# Patient Record
Sex: Male | Born: 1990 | Race: White | Hispanic: No | Marital: Married | Smoking: Former smoker
Health system: Southern US, Community
[De-identification: ages and names within clinical notes are randomized; demographics above are authoritative.]

## PROBLEM LIST (undated history)

## (undated) DIAGNOSIS — F319 Bipolar disorder, unspecified: Secondary | ICD-10-CM

## (undated) DIAGNOSIS — F909 Attention-deficit hyperactivity disorder, unspecified type: Secondary | ICD-10-CM

## (undated) HISTORY — DX: Bipolar disorder, unspecified: F31.9

## (undated) HISTORY — DX: Attention-deficit hyperactivity disorder, unspecified type: F90.9

---

## 2011-10-07 ENCOUNTER — Ambulatory Visit: Payer: Self-pay | Admitting: Internal Medicine

## 2014-02-01 IMAGING — CR DG FOOT COMPLETE 3+V*L*
1 series · 3 of 3 positions shown · non-contrast
Comparison: none

REASON FOR EXAM: L foot pain and swelling due to injury today.
COMMENTS:

[Series 1: ap · 0.17mm/px · 3 of 3 slices shown]
[im 1/3]
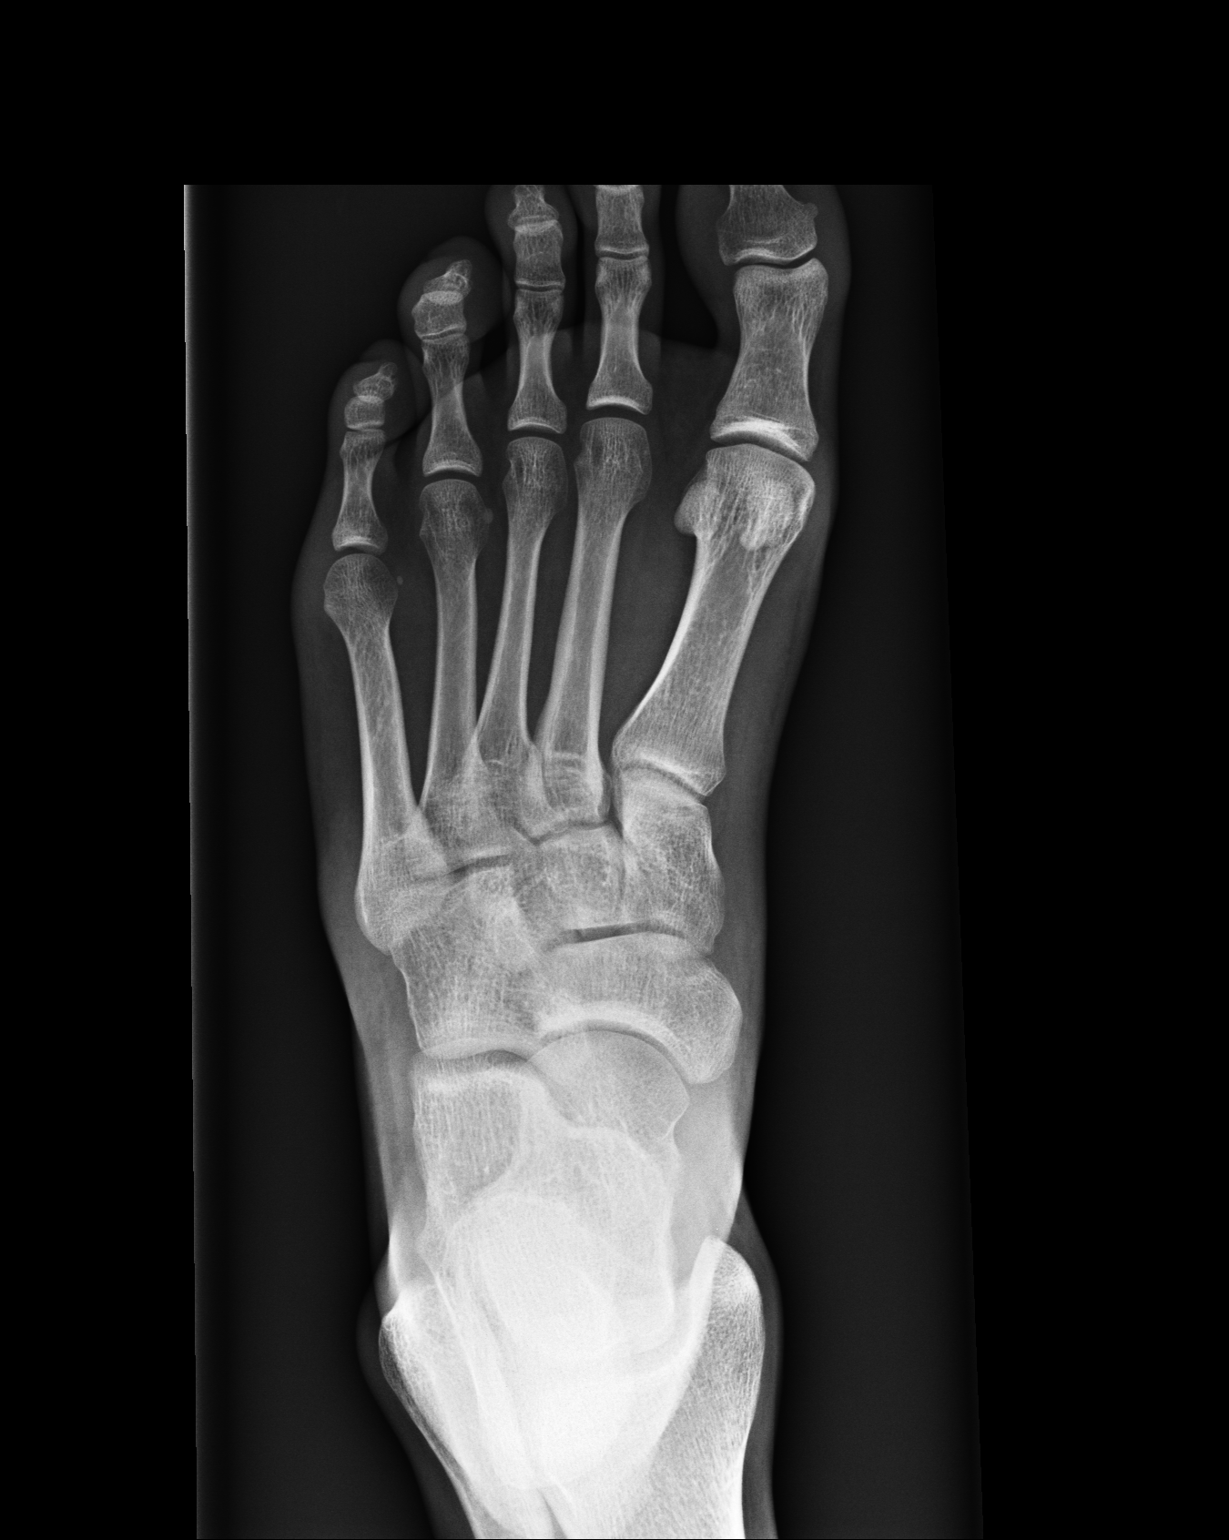
[im 2/3]
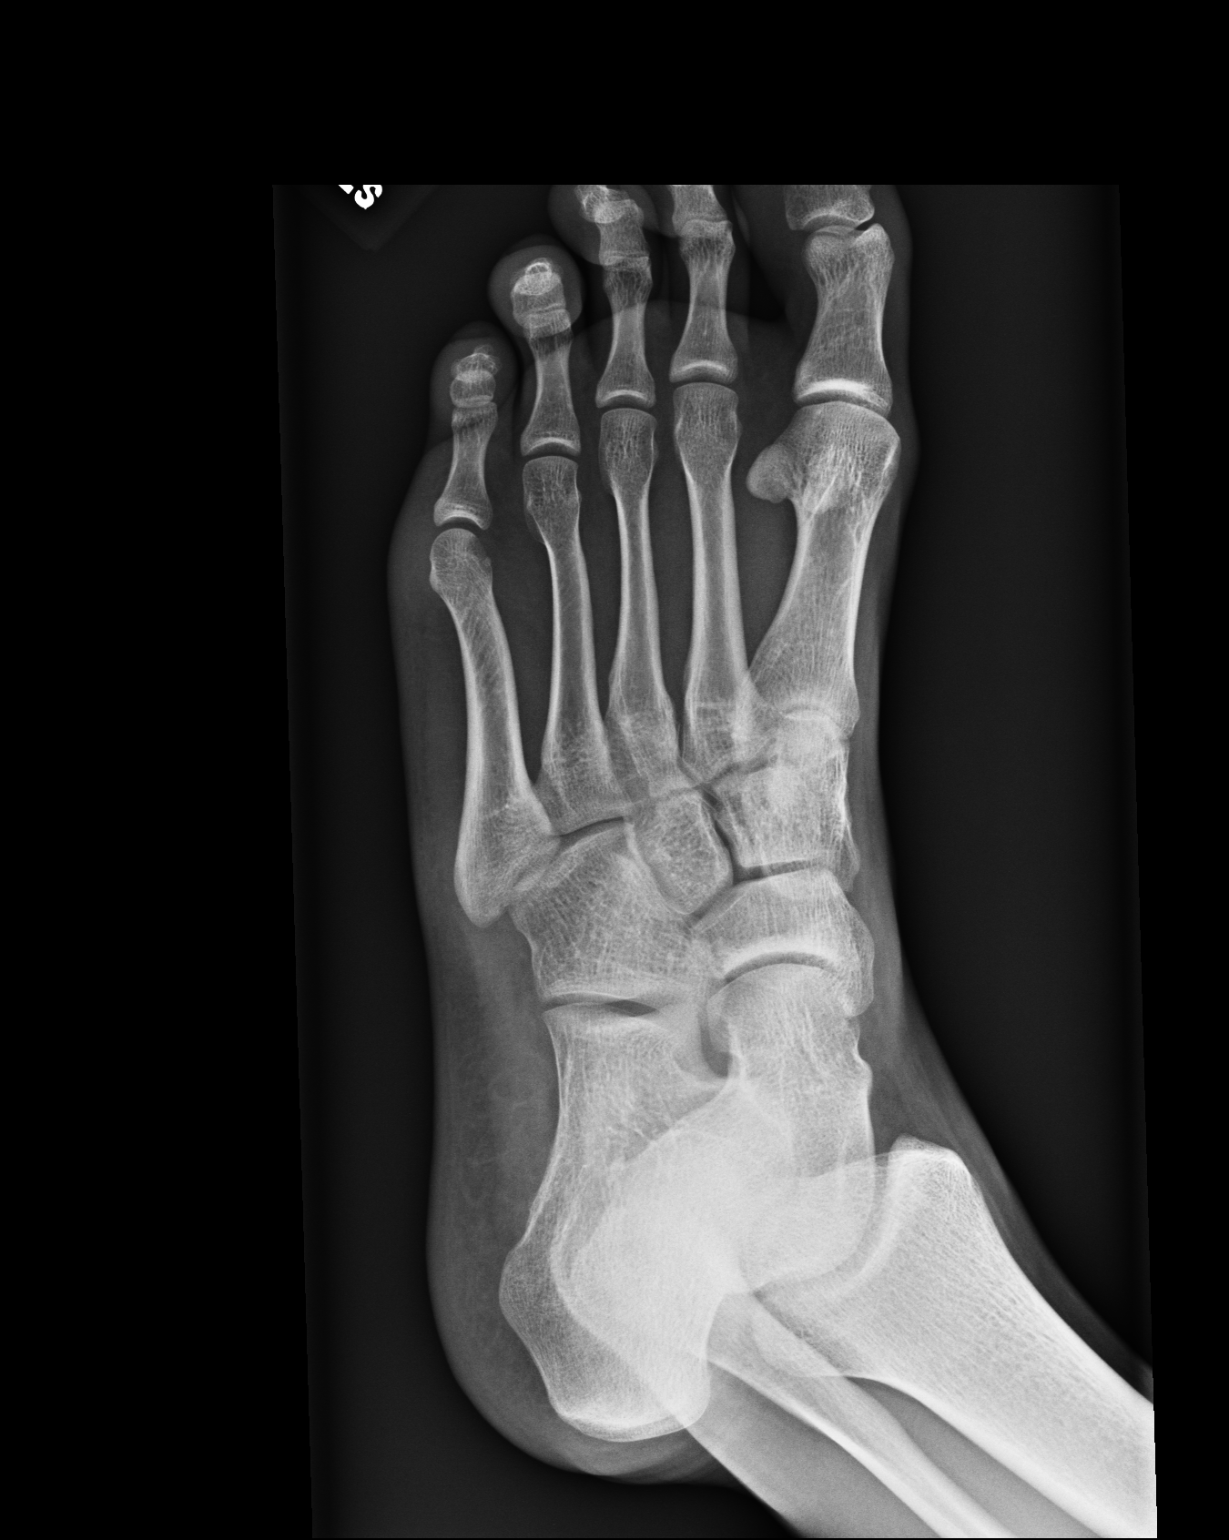
[im 3/3]
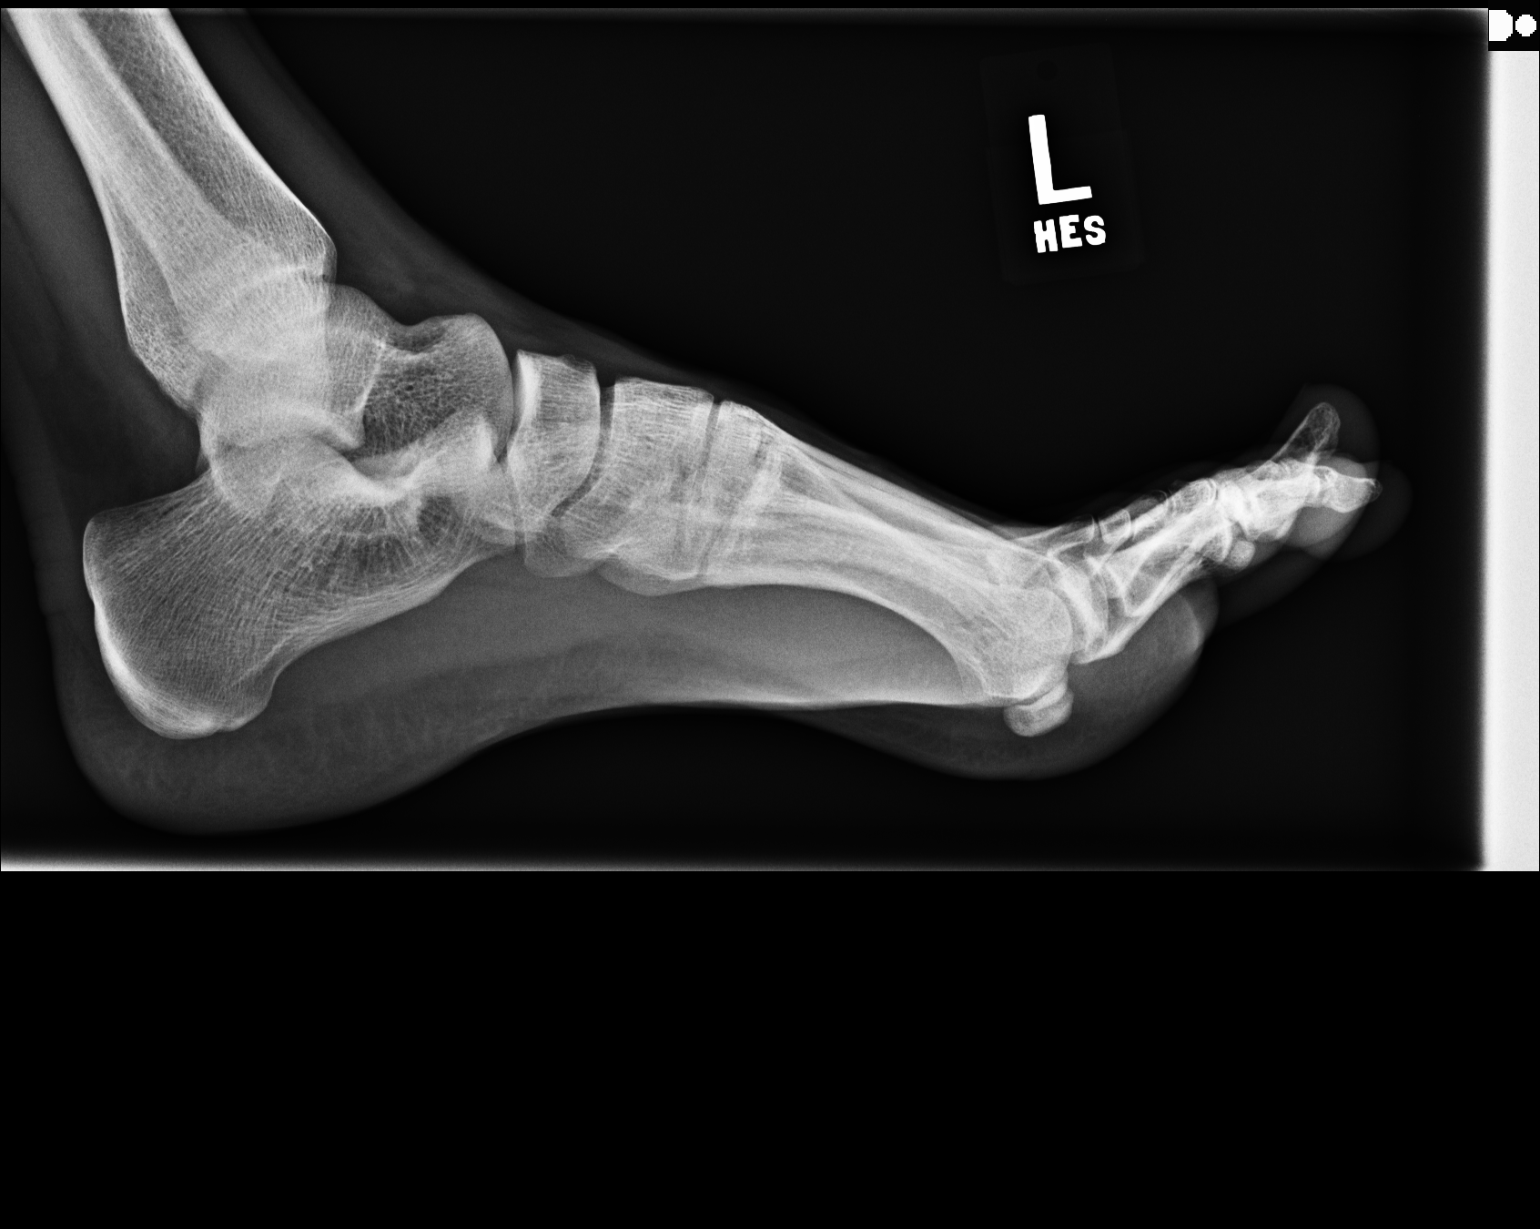

[3 of 3 positions shown; findings below may reference images not displayed]

PROCEDURE:     MDR - MDR FOOT LT COMP W/OBLQUES  - October 07, 2011 [DATE]

RESULT:     Three views of the left foot are submitted. The bones appear
adequately mineralized. Specific attention to the metatarsals reveals no
acute fracture or dislocation. The phalanges as well as the tarsal bones are
grossly normal. The overlying soft tissues are prominent laterally.
IMPRESSION: There is soft tissue swelling over the base of the fifth
metatarsal, but I do not see evidence of an underlying fracture.

[REDACTED]

## 2015-04-03 ENCOUNTER — Encounter: Payer: Self-pay | Admitting: Internal Medicine

## 2015-04-03 ENCOUNTER — Other Ambulatory Visit: Payer: Self-pay | Admitting: Internal Medicine

## 2015-04-03 DIAGNOSIS — F319 Bipolar disorder, unspecified: Secondary | ICD-10-CM | POA: Insufficient documentation

## 2015-04-03 DIAGNOSIS — K580 Irritable bowel syndrome with diarrhea: Secondary | ICD-10-CM | POA: Insufficient documentation

## 2015-04-03 DIAGNOSIS — M4120 Other idiopathic scoliosis, site unspecified: Secondary | ICD-10-CM | POA: Insufficient documentation

## 2018-03-26 ENCOUNTER — Ambulatory Visit: Payer: Self-pay | Admitting: Psychiatry

## 2018-04-01 ENCOUNTER — Encounter: Payer: Self-pay | Admitting: Psychiatry

## 2018-04-01 ENCOUNTER — Other Ambulatory Visit: Payer: Self-pay

## 2018-04-01 ENCOUNTER — Ambulatory Visit (INDEPENDENT_AMBULATORY_CARE_PROVIDER_SITE_OTHER): Payer: BLUE CROSS/BLUE SHIELD | Admitting: Psychiatry

## 2018-04-01 VITALS — BP 130/86 | HR 80 | Temp 98.8°F | Ht 72.0 in | Wt 158.4 lb

## 2018-04-01 DIAGNOSIS — F3181 Bipolar II disorder: Secondary | ICD-10-CM

## 2018-04-01 MED ORDER — BUPROPION HCL 100 MG PO TABS: 100.0000 mg | ORAL_TABLET | Freq: Every day | ORAL | 1 refills | Status: DC

## 2018-04-01 MED ORDER — ARIPIPRAZOLE 5 MG PO TABS: 7.5000 mg | ORAL_TABLET | Freq: Every day | ORAL | 1 refills | Status: DC

## 2018-04-01 NOTE — Patient Instructions (Signed)
Please get your TSH when you go to your Primary medical doctor. If not , we can give you a lab order for [facility].

## 2018-04-01 NOTE — Progress Notes (Signed)
Psychiatric Initial Adult Assessment   Patient Identification: John Stokes MRN:  [id number] Date of Evaluation:  04/01/2018 Referral Source: Magdalene Patricia MD Chief Complaint:  ' I am here to establish care." Chief Complaint    Establish Care; Medication Refill; Manic Behavior; Depression     Visit Diagnosis:    ICD-10-CM   1. Bipolar 2 disorder (HCC) F31.81    mild, mixed    History of Present Illness:  John Stokes is a 27 year old Caucasian male, employed, lives in [place], married, has a history of bipolar disorder type II, presented to the clinic today to establish care.  Patient reports he has been struggling with bipolar disorder since the past several years.  He reports he had at least 2 inpatient mental health admissions in the past around the age of 27 years old.  Patient reports his symptoms were stable up until few months ago on Abilify and Wellbutrin.  He used to be on Abilify 10 mg previously which was tapered down to 2.5 mg in the past.  Patient reports he has new psychosocial stressors including the birth of his daughter in July.  He reports he has to take care of the child as well as has some financial issues which is triggering his current mood symptoms.  Patient describes his mood lability as irritability, agitation, depressed mood and so on which goes back and forth every few hours to a few days.  Patient reports his sleep is also restless.  He reports he has to be up at night to take care of the baby since his wife also works. He hence is not interested in sleep aids.  Pt does report a history of being hyperactive, irritable, periods when he needed less sleep had more energy, was more outgoing and so on.  Patient denies any history of trauma.  Patient denies any history of perceptual disturbances.  Patient denies any suicidality or homicidality.  Patient denies abusing any illicit substances or alcohol.  Patient reports good social support from his wife and his  family.  Associated Signs/Symptoms: Depression Symptoms:  psychomotor agitation, disturbed sleep, (Hypo) Manic Symptoms:  Distractibility, Impulsivity, Irritable Mood, Labiality of Mood, Anxiety Symptoms:  denies Psychotic Symptoms:  denies PTSD Symptoms: Negative  Past Psychiatric History: Reports history of bipolar disorder type II, inpatient mental health admissions x2 around the age of 16 years.  Patient denies suicide attempts.  Patient reports he was admitted at [facility] in the past.  Patient used to be under the care of a provider at [facility] [place] in the past.  Most recently his medications were being prescribed by his primary medical doctor.  Previous Psychotropic Medications: Yes Abilify, Wellbutrin  Substance Abuse History in the last 12 months:  No.  Consequences of Substance Abuse: Negative  Past Medical History:  Past Medical History:  Diagnosis Date  . ADHD (attention deficit hyperactivity disorder)   . Bipolar disorder (HCC)    History reviewed. No pertinent surgical history.  Family Psychiatric History: Father-mental illness, mother-mental illness, sister-mental illness.  Family History:  Family History  Problem Relation Age of Onset  . Anxiety disorder Father   . Alcohol abuse Father   . Depression Mother   . Anxiety disorder Sister     Social History:   Social History   Socioeconomic History  . Marital status: Married    Spouse name: makayla  . Number of children: 1  . Years of education: Not on file  . Highest education level: High school graduate  Occupational History  . Not on file  Social Needs  . Financial resource strain: Not hard at all  . Food insecurity:    Worry: Never true    Inability: Never true  . Transportation needs:    Medical: No    Non-medical: No  Tobacco Use  . Smoking status: Former Smoker    Types: Cigarettes    Last attempt to quit: 04/01/2013    Years since quitting: 5.0  . Smokeless tobacco: Never Used   Substance and Sexual Activity  . Alcohol use: No    Alcohol/week: 0.0 standard drinks  . Drug use: Never  . Sexual activity: Yes  Lifestyle  . Physical activity:    Days per week: 0 days    Minutes per session: 0 min  . Stress: Rather much  Relationships  . Social connections:    Talks on phone: Not on file    Gets together: Not on file    Attends religious service: More than 4 times per year    Active member of club or organization: No    Attends meetings of clubs or organizations: Never    Relationship status: Married  Other Topics Concern  . Not on file  Social History Narrative  . Not on file    Additional Social History: Pt is married.  He is employed as a Hospital doctor at [employer] .  Patient lives in [place] with his wife and 13-month-old daughter.  Patient has good social support from his family.  Patient went up to high school.  Allergies:  No Known Allergies  Metabolic Disorder Labs: No results found for: HGBA1C, MPG No results found for: PROLACTIN No results found for: CHOL, TRIG, HDL, CHOLHDL, VLDL, LDLCALC No results found for: TSH  Therapeutic Level Labs: No results found for: LITHIUM No results found for: CBMZ No results found for: VALPROATE  Current Medications: Current Outpatient Medications  Medication Sig Dispense Refill  . ARIPiprazole (ABILIFY) 5 MG tablet Take 1.5 tablets (7.5 mg total) by mouth daily. 45 tablet 1  . buPROPion (WELLBUTRIN) 100 MG tablet Take 1 tablet (100 mg total) by mouth daily. 30 tablet 1   No current facility-administered medications for this visit.     Musculoskeletal: Strength & Muscle Tone: within normal limits Gait & Station: normal Patient leans: N/A  Psychiatric Specialty Exam: Review of Systems  Psychiatric/Behavioral: Positive for depression. The patient is nervous/anxious and has insomnia.   All other systems reviewed and are negative.   Blood pressure 130/86, pulse 80, temperature 98.8 F  (37.1 C), temperature source Oral, height 6' (1.829 m), weight 158 lb 6.4 oz (71.8 kg).Body mass index is 21.48 kg/m.  General Appearance: Casual  Eye Contact:  Fair  Speech:  Clear and Coherent  Volume:  Normal  Mood:  Dysphoric  Affect:  Appropriate  Thought Process:  Goal Directed and Descriptions of Associations: Intact  Orientation:  Full (Time, Place, and Person)  Thought Content:  Logical  Suicidal Thoughts:  No  Homicidal Thoughts:  No  Memory:  Immediate;   Fair Recent;   Fair Remote;   Fair  Judgement:  Fair  Insight:  Fair  Psychomotor Activity:  Normal  Concentration:  Concentration: Fair and Attention Span: Fair  Recall:  [employer] of Knowledge:Fair  Language: Fair  Akathisia:  No  Handed:  Right  AIMS (if indicated): denies tremors, rigidity,stiffness  Assets:  Communication Skills Desire for Improvement Social Support  ADL's:  Intact  Cognition: WNL  Sleep:  restless since he has a newborn at home   Screenings:   Assessment and Plan: John Stokes is a 27 year old Caucasian male, lives in [place], has a history of bipolar disorder type II, presented to the clinic today to establish care.  Patient is biologically predisposed given his family history of mental health problems.  Patient has psychosocial stressors of having a newborn baby.  Patient is compliant on his medications and is motivated to stay on it.  Patient denies any suicidality or perceptual disturbances or homicidality.  Plan as noted below.  Plan Bipolar disorder type II Increase Abilify to 7.5 mg daily Change Wellbutrin to 100 mg p.o. daily.  We will get the following labs-TSH.  Patient reports she will get it from his PMD.  Pt was able to fill out a mood disorder questionnaire and scored very high on the same.  Follow-up in clinic in 2 weeks or sooner if needed.  More than 50 % of the time was spent for psychoeducation and supportive psychotherapy and care coordination.  This note was  generated in part or whole with voice recognition software. Voice recognition is usually quite accurate but there are transcription errors that can and very often do occur. I apologize for any typographical errors that were not detected and corrected.       John LongsSaramma Xaiden Fleig, MD 12/9/201912:22 PM

## 2018-04-08 ENCOUNTER — Telehealth: Payer: Self-pay

## 2018-04-08 NOTE — Telephone Encounter (Signed)
approveal nottice for the aripiprazole 5mg   take 1/5 tables was authorizated from  04-03-18 to  04-01-2021

## 2018-04-15 ENCOUNTER — Encounter: Payer: Self-pay | Admitting: Psychiatry

## 2018-04-15 ENCOUNTER — Ambulatory Visit (INDEPENDENT_AMBULATORY_CARE_PROVIDER_SITE_OTHER): Payer: BLUE CROSS/BLUE SHIELD | Admitting: Psychiatry

## 2018-04-15 ENCOUNTER — Other Ambulatory Visit: Payer: Self-pay

## 2018-04-15 VITALS — BP 113/77 | HR 74 | Temp 98.2°F | Wt 158.8 lb

## 2018-04-15 DIAGNOSIS — F3181 Bipolar II disorder: Secondary | ICD-10-CM | POA: Diagnosis not present

## 2018-04-15 NOTE — Progress Notes (Signed)
BH MD OP Progress Note  04/15/2018 5:06 PM Jaclyn ShaggyKyle L Butrick  MRN:  [id number]  Chief Complaint: 'I am here for follow up.' Chief Complaint    Follow-up; Medication Refill     HPI: John Stokes is a 27 year old Caucasian male, employed, lives in [place], married, has a history of bipolar disorder type II, presented to the clinic today for a follow-up visit.  Patient today reports he is tolerating the Abilify increased dosage well.  He is compliant with it.  He reports he feels more balanced with regards to his mood at this time.  He reports sleep is good.  He reports work is going well.  He continues to have a lot of stressors from having a newborn and having to manage his work and so on.  He will [facility] seeing a [facility].  He denies any other concerns today. Visit Diagnosis:    ICD-10-CM   1. Bipolar 2 disorder (HCC) F31.81    mixed,mild    Past Psychiatric History: I have reviewed past psychiatric history from my progress note on 04/01/2018.  Past trials of Abilify, Wellbutrin.  Past Medical History:  Past Medical History:  Diagnosis Date  . ADHD (attention deficit hyperactivity disorder)   . Bipolar disorder (HCC)    History reviewed. No pertinent surgical history.  Family Psychiatric History: Reviewed family psychiatric history from my progress note on 04/01/2018.  Family History:  Family History  Problem Relation Age of Onset  . Anxiety disorder Father   . Alcohol abuse Father   . Depression Mother   . Anxiety disorder Sister     Social History: Reviewed social history from my progress note on 04/01/2018. Social History   Socioeconomic History  . Marital status: Married    Spouse name: makayla  . Number of children: 1  . Years of education: Not on file  . Highest education level: High school graduate  Occupational History  . Not on file  Social Needs  . Financial resource strain: Not hard at all  . Food insecurity:    Worry: Never true   Inability: Never true  . Transportation needs:    Medical: No    Non-medical: No  Tobacco Use  . Smoking status: Former Smoker    Types: Cigarettes    Last attempt to quit: 04/01/2013    Years since quitting: 5.0  . Smokeless tobacco: Never Used  Substance and Sexual Activity  . Alcohol use: No    Alcohol/week: 0.0 standard drinks  . Drug use: Never  . Sexual activity: Yes  Lifestyle  . Physical activity:    Days per week: 0 days    Minutes per session: 0 min  . Stress: Rather much  Relationships  . Social connections:    Talks on phone: Not on file    Gets together: Not on file    Attends religious service: More than 4 times per year    Active member of club or organization: No    Attends meetings of clubs or organizations: Never    Relationship status: Married  Other Topics Concern  . Not on file  Social History Narrative  . Not on file    Allergies: No Known Allergies  Metabolic Disorder Labs: No results found for: HGBA1C, MPG No results found for: PROLACTIN No results found for: CHOL, TRIG, HDL, CHOLHDL, VLDL, LDLCALC No results found for: TSH  Therapeutic Level Labs: No results found for: LITHIUM No results found for: VALPROATE No components found  for:  CBMZ  Current Medications: Current Outpatient Medications  Medication Sig Dispense Refill  . ARIPiprazole (ABILIFY) 5 MG tablet Take 1.5 tablets (7.5 mg total) by mouth daily. 45 tablet 1  . buPROPion (WELLBUTRIN) 100 MG tablet Take 1 tablet (100 mg total) by mouth daily. 30 tablet 1   No current facility-administered medications for this visit.      Musculoskeletal: Strength & Muscle Tone: within normal limits Gait & Station: normal Patient leans: N/A  Psychiatric Specialty Exam: Review of Systems  Psychiatric/Behavioral: The patient is nervous/anxious.   All other systems reviewed and are negative.   Blood pressure 113/77, pulse 74, temperature 98.2 F (36.8 C), temperature source Oral, weight  158 lb 12.8 oz (72 kg).Body mass index is 21.54 kg/m.  General Appearance: Casual  Eye Contact:  Fair  Speech:  Clear and Coherent  Volume:  Normal  Mood:  Anxious improving  Affect:  Congruent  Thought Process:  Goal Directed and Descriptions of Associations: Intact  Orientation:  Full (Time, Place, and Person)  Thought Content: Logical   Suicidal Thoughts:  No  Homicidal Thoughts:  No  Memory:  Immediate;   Fair Recent;   Fair Remote;   Fair  Judgement:  Fair  Insight:  Fair  Psychomotor Activity:  Normal  Concentration:  Concentration: Fair and Attention Span: Fair  Recall:  [employer]Fair  Fund of Knowledge: Fair  Language: Fair  Akathisia:  No  Handed:  Right  AIMS (if indicated): 0  Assets:  Communication Skills Desire for Improvement Social Support  ADL's:  Intact  Cognition: WNL  Sleep:  Fair   Screenings: AIMS     [facility] Visit from 04/15/2018 in [facility]  AIMS Total Score  0       Assessment and Plan: John Stokes is a 27 year old Caucasian male, lives in [place], has a history of bipolar disorder type II, presented to the clinic today for a follow-up visit.  Patient is biologically predisposed given his family history of mental health problems.  Patient also has psychosocial stressors of having a newborn baby as well as work.  Patient will continue to benefit from medications as well as psychotherapy sessions.  Plan as noted below.  Plan Bipolar disorder type II Abilify 7.5 mg p.o. daily Continue Wellbutrin at reduced dosage of 100 mg p.o. daily.  TSH-labs-pending.  Patient will start [facility] session with [facility].  Follow-up in clinic in 1 month or sooner if needed.  More than 50 % of the time was spent for psychoeducation and supportive psychotherapy and care coordination.  This note was generated in part or whole with voice recognition software. Voice recognition is usually quite accurate but there are  transcription errors that can and very often do occur. I apologize for any typographical errors that were not detected and corrected.      Jomarie LongsSaramma Jerremy Maione, MD 04/15/2018, 5:06 PM

## 2018-05-16 ENCOUNTER — Encounter: Payer: Self-pay | Admitting: Psychiatry

## 2018-05-16 ENCOUNTER — Other Ambulatory Visit: Payer: Self-pay

## 2018-05-16 ENCOUNTER — Ambulatory Visit (INDEPENDENT_AMBULATORY_CARE_PROVIDER_SITE_OTHER): Payer: 59 | Admitting: Psychiatry

## 2018-05-16 VITALS — BP 107/75 | HR 78 | Temp 97.8°F | Wt 160.0 lb

## 2018-05-16 DIAGNOSIS — F3181 Bipolar II disorder: Secondary | ICD-10-CM | POA: Diagnosis not present

## 2018-05-16 MED ORDER — BUPROPION HCL 100 MG PO TABS: 100.0000 mg | ORAL_TABLET | Freq: Every day | ORAL | 2 refills | Status: DC

## 2018-05-16 MED ORDER — ARIPIPRAZOLE 5 MG PO TABS: 7.5000 mg | ORAL_TABLET | Freq: Every day | ORAL | 2 refills | Status: DC

## 2018-05-16 NOTE — Progress Notes (Signed)
BH MD OP Progress Note  05/16/2018 5:46 PM Jaclyn ShaggyKyle L Benney  MRN:  [id number]  Chief Complaint: ' I am here for follow up.' Chief Complaint    Follow-up; Medication Refill     HPI: Ronaldo MiyamotoKyle is a 28 year old Caucasian male, employed, lives in [place], married, has a history of bipolar disorder type II, presented to clinic today for a follow-up visit.  Patient today reports he is tolerating the Abilify well.  He denies any side effects.  He reports his mood symptoms are more stable on the current medication regimen.  Patient reports sleep is good.  Patient denies any suicidality, perceptual disturbances or homicidality.  Patient reports work is going well.  He reports he has started psychotherapy sessions with [facility].  He reports therapy sessions is going well. Visit Diagnosis:    ICD-10-CM   1. Bipolar 2 disorder (HCC) F31.81    mixed, mild    Past Psychiatric History: I have reviewed past psychiatric history from my progress note on 04/01/2018.  Past trials of Abilify, Wellbutrin.  Past Medical History:  Past Medical History:  Diagnosis Date  . ADHD (attention deficit hyperactivity disorder)   . Bipolar disorder (HCC)    History reviewed. No pertinent surgical history.  Family Psychiatric History: I have reviewed family psychiatric history from my progress note on 04/01/2018.  Family History:  Family History  Problem Relation Age of Onset  . Anxiety disorder Father   . Alcohol abuse Father   . Depression Mother   . Anxiety disorder Sister     Social History: Reviewed social history from my progress note on 04/01/2018. Social History   Socioeconomic History  . Marital status: Married    Spouse name: makayla  . Number of children: 1  . Years of education: Not on file  . Highest education level: High school graduate  Occupational History  . Not on file  Social Needs  . Financial resource strain: Not hard at all  . Food insecurity:    Worry:  Never true    Inability: Never true  . Transportation needs:    Medical: No    Non-medical: No  Tobacco Use  . Smoking status: Former Smoker    Types: Cigarettes    Last attempt to quit: 04/01/2013    Years since quitting: 5.1  . Smokeless tobacco: Never Used  Substance and Sexual Activity  . Alcohol use: No    Alcohol/week: 0.0 standard drinks  . Drug use: Never  . Sexual activity: Yes  Lifestyle  . Physical activity:    Days per week: 0 days    Minutes per session: 0 min  . Stress: Rather much  Relationships  . Social connections:    Talks on phone: Not on file    Gets together: Not on file    Attends religious service: More than 4 times per year    Active member of club or organization: No    Attends meetings of clubs or organizations: Never    Relationship status: Married  Other Topics Concern  . Not on file  Social History Narrative  . Not on file    Allergies: No Known Allergies  Metabolic Disorder Labs: No results found for: HGBA1C, MPG No results found for: PROLACTIN No results found for: CHOL, TRIG, HDL, CHOLHDL, VLDL, LDLCALC No results found for: TSH  Therapeutic Level Labs: No results found for: LITHIUM No results found for: VALPROATE No components found for:  CBMZ  Current Medications: Current Outpatient  Medications  Medication Sig Dispense Refill  . ARIPiprazole (ABILIFY) 5 MG tablet Take 1.5 tablets (7.5 mg total) by mouth daily. 45 tablet 2  . buPROPion (WELLBUTRIN) 100 MG tablet Take 1 tablet (100 mg total) by mouth daily. 30 tablet 2   No current facility-administered medications for this visit.      Musculoskeletal: Strength & Muscle Tone: within normal limits Gait & Station: normal Patient leans: N/A  Psychiatric Specialty Exam: Review of Systems  Psychiatric/Behavioral: The patient is nervous/anxious (improving).   All other systems reviewed and are negative.   Blood pressure 107/75, pulse 78, temperature 97.8 F (36.6 C),  temperature source Oral, weight 160 lb (72.6 kg).Body mass index is 21.7 kg/m.  General Appearance: Casual  Eye Contact:  Fair  Speech:  Clear and Coherent  Volume:  Normal  Mood:  Anxious  Affect:  Appropriate  Thought Process:  Goal Directed and Descriptions of Associations: Intact  Orientation:  Full (Time, Place, and Person)  Thought Content: Logical   Suicidal Thoughts:  No  Homicidal Thoughts:  No  Memory:  Immediate;   Fair Recent;   Fair Remote;   Fair  Judgement:  Fair  Insight:  Fair  Psychomotor Activity:  Normal  Concentration:  Concentration: Fair and Attention Span: Fair  Recall:  [employer] of Knowledge: Fair  Language: Fair  Akathisia:  No  Handed:  Right  AIMS (if indicated): 0  Assets:  Communication Skills Desire for Improvement Social Support  ADL's:  Intact  Cognition: WNL  Sleep:  Fair   Screenings: AIMS     [facility]/15/2018 [facility]  AIMS Total Score  0       Assessment and Plan: Elridge is a 28 year old Caucasian male, lives in [place], has a history of bipolar disorder type II, presented to clinic today for a follow-up visit.  Patient is biologically predisposed given his family history of mental health problems.  He also has psychosocial stressors of work as well as having a newborn baby.  Patient is currently making progress on the current medication regimen.  Will continue plan as noted below.  Plan Bipolar disorder type II-improving Abilify 7.5 mg p.o. daily Wellbutrin at reduced dosage of 100 mg p.o. daily  Discussed obtaining records from his primary medical doctor, labs-most recent TSH that was done.  Also discussed referral to psychotherapist here in clinic.  He has started psychotherapy sessions at [facility] however is not sure if he wants to continue there or not.  Follow-up in clinic in 2 months or sooner if needed.  I have spent atleast 15 minutes  face to face with patient  today. More than 50 % of the time was spent for psychoeducation and supportive psychotherapy and care coordination.    Jomarie Longs, MD 05/16/2018, 5:46 PM

## 2018-07-04 ENCOUNTER — Telehealth: Payer: Self-pay

## 2018-07-04 ENCOUNTER — Telehealth: Payer: Self-pay | Admitting: Psychiatry

## 2018-07-04 DIAGNOSIS — F319 Bipolar disorder, unspecified: Secondary | ICD-10-CM

## 2018-07-04 MED ORDER — ARIPIPRAZOLE 5 MG PO TABS: 10.0000 mg | ORAL_TABLET | Freq: Every day | ORAL | 0 refills | Status: DC

## 2018-07-04 NOTE — Telephone Encounter (Signed)
Spoke to patient , will increase Abilify. Discussed seeing in clinic sooner. Will pass the message to [facility] about sooner appointment. Crisis plan discussed.

## 2018-07-04 NOTE — Telephone Encounter (Signed)
pt called states he needed to speak with you about his medication.

## 2018-07-04 NOTE — Telephone Encounter (Signed)
pt called again states his medication is not working for him he is feeling worse.

## 2018-07-04 NOTE — Telephone Encounter (Signed)
John Stokes, please call patient and ask him to schedule an appointment sooner

## 2018-07-10 ENCOUNTER — Ambulatory Visit (INDEPENDENT_AMBULATORY_CARE_PROVIDER_SITE_OTHER): Payer: 59 | Admitting: Psychiatry

## 2018-07-10 ENCOUNTER — Other Ambulatory Visit: Payer: Self-pay

## 2018-07-10 ENCOUNTER — Encounter: Payer: Self-pay | Admitting: Psychiatry

## 2018-07-10 VITALS — BP 108/72 | HR 89 | Temp 97.5°F | Wt 154.0 lb

## 2018-07-10 DIAGNOSIS — G4700 Insomnia, unspecified: Secondary | ICD-10-CM

## 2018-07-10 DIAGNOSIS — F319 Bipolar disorder, unspecified: Secondary | ICD-10-CM

## 2018-07-10 NOTE — Progress Notes (Signed)
BH MD OP Progress Note  07/10/2018 3:56 PM John Stokes  MRN:  [id number]  Chief Complaint: ' I am here for follow up." Chief Complaint    Follow-up; Medication Refill     HPI: John Stokes is a 28 year old Caucasian male, employed, lives in [place], married, has a history of bipolar disorder type II, presented to clinic today for a follow-up visit.  Patient today reports that he is doing better on the current medication regimen.  He reports he started Abilify 10 milligram which helps.  Patient reports he continues to have some sleep issues more so because of his 28-month-old daughter waking him up.  He however reports it is getting better though.  He reports he had several situational stressors last week including his car breaking down.  He reports that made him anxious and depressed .  He continues to feel tired during the day however reports he has been taking some multivitamin supplements over-the-counter which has helped.  He also has made some changes in his lifestyle and diet which also seems to be helpful.  He currently denies any suicidality, homicidality or perceptual disturbances.  He reports he has good social support system from his mother, his wife and friends.  He will start psychotherapy sessions with [facility] soon.  Patient denies any other concerns today.  Visit Diagnosis:    ICD-10-CM   1. Bipolar I disorder (HCC) F31.9    mixed  2. Insomnia, unspecified type G47.00     Past Psychiatric History: Reviewed past psychiatric history from my progress note on 04/01/2018.  Past trials of Abilify, Wellbutrin.  Past Medical History:  Past Medical History:  Diagnosis Date  . ADHD (attention deficit hyperactivity disorder)   . Bipolar disorder (HCC)    History reviewed. No pertinent surgical history.  Family Psychiatric History: Reviewed family psychiatric history from my progress note on 04/01/2018.  Family History:  Family History  Problem Relation Age of Onset  . Anxiety  disorder Father   . Alcohol abuse Father   . Depression Mother   . Anxiety disorder Sister     Social History: Reviewed social history from my progress note on 04/01/2018. Social History   Socioeconomic History  . Marital status: Married    Spouse name: makayla  . Number of children: 1  . Years of education: Not on file  . Highest education level: High school graduate  Occupational History  . Not on file  Social Needs  . Financial resource strain: Not hard at all  . Food insecurity:    Worry: Never true    Inability: Never true  . Transportation needs:    Medical: No    Non-medical: No  Tobacco Use  . Smoking status: Former Smoker    Types: Cigarettes    Last attempt to quit: 04/01/2013    Years since quitting: 5.2  . Smokeless tobacco: Never Used  Substance and Sexual Activity  . Alcohol use: No    Alcohol/week: 0.0 standard drinks  . Drug use: Never  . Sexual activity: Yes  Lifestyle  . Physical activity:    Days per week: 0 days    Minutes per session: 0 min  . Stress: Rather much  Relationships  . Social connections:    Talks on phone: Not on file    Gets together: Not on file    Attends religious service: More than 4 times per year    Active member of club or organization: No    Attends meetings of  clubs or organizations: Never    Relationship status: Married  Other Topics Concern  . Not on file  Social History Narrative  . Not on file    Allergies: No Known Allergies  Metabolic Disorder Labs: No results found for: HGBA1C, MPG No results found for: PROLACTIN No results found for: CHOL, TRIG, HDL, CHOLHDL, VLDL, LDLCALC No results found for: TSH  Therapeutic Level Labs: No results found for: LITHIUM No results found for: VALPROATE No components found for:  CBMZ  Current Medications: Current Outpatient Medications  Medication Sig Dispense Refill  . ARIPiprazole (ABILIFY) 5 MG tablet Take 2 tablets (10 mg total) by mouth daily. 60 tablet 0  .  buPROPion (WELLBUTRIN) 100 MG tablet Take 1 tablet (100 mg total) by mouth daily. 30 tablet 2   No current facility-administered medications for this visit.      Musculoskeletal: Strength & Muscle Tone: within normal limits Gait & Station: normal Patient leans: N/A  Psychiatric Specialty Exam: Review of Systems  Psychiatric/Behavioral: Positive for depression. The patient is nervous/anxious.   All other systems reviewed and are negative.   Blood pressure 108/72, pulse 89, temperature (!) 97.5 F (36.4 C), temperature source Oral, weight 154 lb (69.9 kg).Body mass index is 20.89 kg/m.  General Appearance: Casual  Eye Contact:  Fair  Speech:  Clear and Coherent  Volume:  Normal  Mood:  Anxious  Affect:  Congruent  Thought Process:  Goal Directed and Descriptions of Associations: Intact  Orientation:  Full (Time, Place, and Person)  Thought Content: Logical   Suicidal Thoughts:  No  Homicidal Thoughts:  No  Memory:  Immediate;   Fair Recent;   Fair Remote;   Fair  Judgement:  Fair  Insight:  Fair  Psychomotor Activity:  Normal  Concentration:  Concentration: Fair and Attention Span: Fair  Recall:  [employer] of Knowledge: Fair  Language: Fair  Akathisia:  No  Handed:  Right  AIMS (if indicated): denies tremors, rigidity  Assets:  Communication Skills Desire for Improvement Housing Social Support  ADL's:  Intact  Cognition: WNL  Sleep:  restless at times   Screenings: AIMS     [facility] Visit from 04/15/2018 in [facility]  AIMS Total Score  0       Assessment and Plan: John Stokes is a 28 year old Caucasian male, lives in [place], has a history of bipolar disorder type II, presented to the clinic today for a follow-up visit.  Patient is biologically predisposed given his family history of mental health problems.  He also has psychosocial stressors of work as well as having a newborn baby.  Patient however reports today that he is making  some progress on the higher dosage of Abilify.  He does not want more medication changes today.  We will continue plan as noted below.  Plan Bipolar disorder type II-some progress Abilify 10 mg p.o. daily Wellbutrin at reduced dosage of 100 mg p.o. daily.  Insomnia-unspecified-improving Continue sleep hygiene techniques.  Pending labs-TSH.  Patient has been referred for psychotherapy, he will start psychotherapy sessions with [facility]-therapist Baart McCormick.  Discussed crisis plan with patient.  Patient currently has acute risk of suicide-low given his improvement on medications, he is motivated to continue to be in treatment, he will start psychotherapy session soon, denies suicidal ideation at this time, is employed, married, has child at home, denies substance abuse problems.  Chronic risk for suicide is moderate since he does have a history of suicide attempt as a  teenager, and has chronic mental health problems.  Follow-up in clinic in 2 - 3 weeks or sooner if needed.  I have spent atleast 15 minutes  face to face with patient today. More than 50 % of the time was spent for psychoeducation and supportive psychotherapy and care coordination.  This note was generated in part or whole with voice recognition software. Voice recognition is usually quite accurate but there are transcription errors that can and very often do occur. I apologize for any typographical errors that were not detected and corrected.           Jomarie Longs, MD 07/10/2018, 3:56 PM

## 2018-07-15 ENCOUNTER — Telehealth: Payer: Self-pay

## 2018-07-15 DIAGNOSIS — F319 Bipolar disorder, unspecified: Secondary | ICD-10-CM

## 2018-07-15 MED ORDER — ARIPIPRAZOLE 15 MG PO TABS: 15.0000 mg | ORAL_TABLET | Freq: Every day | ORAL | 0 refills | Status: DC

## 2018-07-15 NOTE — Telephone Encounter (Signed)
pt called states that last time you talked with him you said something about trying a mood stablizer and he calling to see if you would do it.

## 2018-07-15 NOTE — Telephone Encounter (Signed)
Spoke to patient.discussed increasing Abilify to 15 mg. He agrees with plan. He has appointment to be seen in clinic on April 7 th.

## 2018-07-24 NOTE — Telephone Encounter (Signed)
Looks as if he called and made an appt for 07-28-18

## 2018-07-30 ENCOUNTER — Other Ambulatory Visit: Payer: Self-pay

## 2018-07-30 ENCOUNTER — Encounter: Payer: Self-pay | Admitting: Psychiatry

## 2018-07-30 ENCOUNTER — Ambulatory Visit (INDEPENDENT_AMBULATORY_CARE_PROVIDER_SITE_OTHER): Payer: 59 | Admitting: Psychiatry

## 2018-07-30 DIAGNOSIS — G4700 Insomnia, unspecified: Secondary | ICD-10-CM

## 2018-07-30 DIAGNOSIS — F3181 Bipolar II disorder: Secondary | ICD-10-CM | POA: Diagnosis not present

## 2018-07-30 MED ORDER — LAMOTRIGINE 25 MG PO TABS: 25.0000 mg | ORAL_TABLET | ORAL | 1 refills | Status: DC

## 2018-07-30 MED ORDER — ARIPIPRAZOLE 15 MG PO TABS: 15.0000 mg | ORAL_TABLET | Freq: Every day | ORAL | 1 refills | Status: DC

## 2018-07-30 MED ORDER — BUPROPION HCL 100 MG PO TABS: 100.0000 mg | ORAL_TABLET | Freq: Every day | ORAL | 2 refills | Status: DC

## 2018-07-30 NOTE — Progress Notes (Signed)
07-30-18 @ 1:09. Pt allergies, and medical and surgery hx were reviewed with no changes.  Pt medications and pharmacy was reviewed with no changes. No vitals were taken because this was a phone visit. 

## 2018-07-30 NOTE — Patient Instructions (Signed)
Lamotrigine tablets What is this medicine? LAMOTRIGINE (la MOE tri jeen) is used to control seizures in adults and children with epilepsy and Lennox-Gastaut syndrome. It is also used in adults to treat bipolar disorder. This medicine may be used for other purposes; ask your health care provider or pharmacist if you have questions. COMMON BRAND NAME(S): Lamictal, Subvenite What should I tell my health care provider before I take this medicine? They need to know if you have any of these conditions: -aseptic meningitis during prior use of lamotrigine -depression -folate deficiency -kidney disease -liver disease -suicidal thoughts, plans, or attempt; a previous suicide attempt by you or a family member -an unusual or allergic reaction to lamotrigine or other seizure medications, other medicines, foods, dyes, or preservatives -pregnant or trying to get pregnant -breast-feeding How should I use this medicine? Take this medicine by mouth with a glass of water. Follow the directions on the prescription label. Do not chew these tablets. If this medicine upsets your stomach, take it with food or milk. Take your doses at regular intervals. Do not take your medicine more often than directed. A special MedGuide will be given to you by the pharmacist with each new prescription and refill. Be sure to read this information carefully each time. Talk to your pediatrician regarding the use of this medicine in children. While this drug may be prescribed for children as young as 2 years for selected conditions, precautions do apply. Overdosage: If you think you have taken too much of this medicine contact a poison control center or emergency room at once. NOTE: This medicine is only for you. Do not share this medicine with others. What if I miss a dose? If you miss a dose, take it as soon as you can. If it is almost time for your next dose, take only that dose. Do not take double or extra doses. What may interact  with this medicine? -atazanavir -carbamazepine -male hormones, including contraceptive or birth control pills -lopinavir -methotrexate -phenobarbital -phenytoin -primidone -pyrimethamine -rifampin -ritonavir -trimethoprim -valproic acid This list may not describe all possible interactions. Give your health care provider a list of all the medicines, herbs, non-prescription drugs, or dietary supplements you use. Also tell them if you smoke, drink alcohol, or use illegal drugs. Some items may interact with your medicine. What should I watch for while using this medicine? Visit your doctor or health care professional for regular checks on your progress. If you take this medicine for seizures, wear a Medic Alert bracelet or necklace. Carry an identification card with information about your condition, medicines, and doctor or health care professional. It is important to take this medicine exactly as directed. When first starting treatment, your dose will need to be adjusted slowly. It may take weeks or months before your dose is stable. You should contact your doctor or health care professional if your seizures get worse or if you have any new types of seizures. Do not stop taking this medicine unless instructed by your doctor or health care professional. Stopping your medicine suddenly can increase your seizures or their severity. Contact your doctor or health care professional right away if you develop a rash while taking this medicine. Rashes may be very severe and sometimes require treatment in the hospital. Deaths from rashes have occurred. Serious rashes occur more often in children than adults taking this medicine. It is more common for these serious rashes to occur during the first 2 months of treatment, but a rash can occur at   any time. You may get drowsy, dizzy, or have blurred vision. Do not drive, use machinery, or do anything that needs mental alertness until you know how this medicine  affects you. To reduce dizzy or fainting spells, do not sit or stand up quickly, especially if you are an older patient. Alcohol can increase drowsiness and dizziness. Avoid alcoholic drinks. If you are taking this medicine for bipolar disorder, it is important to report any changes in your mood to your doctor or health care professional. If your condition gets worse, you get mentally depressed, feel very hyperactive or manic, have difficulty sleeping, or have thoughts of hurting yourself or committing suicide, you need to get help from your health care professional right away. If you are a caregiver for someone taking this medicine for bipolar disorder, you should also report these behavioral changes right away. The use of this medicine may increase the chance of suicidal thoughts or actions. Pay special attention to how you are responding while on this medicine. Your mouth may get dry. Chewing sugarless gum or sucking hard candy, and drinking plenty of water may help. Contact your doctor if the problem does not go away or is severe. Women who become pregnant while using this medicine may enroll in the [place] American Antiepileptic Drug Pregnancy Registry by calling 1-888-233-2334. This registry collects information about the safety of antiepileptic drug use during pregnancy. This medicine may cause a decrease in folic acid. You should make sure that you get enough folic acid while you are taking this medicine. Discuss the foods you eat and the vitamins you take with your health care professional. What side effects may I notice from receiving this medicine? Side effects that you should report to your doctor or health care professional as soon as possible: -allergic reactions like skin rash, itching or hives, swelling of the face, lips, or tongue -changes in vision -depressed mood -elevated mood, decreased need for sleep, racing thoughts, impulsive behavior -fever with rash, swollen lymph nodes, or  swelling of the face -loss of balance or coordination -mouth sores -redness, blistering, peeling or loosening of the skin, including inside the mouth -right upper belly pain -seizures -severe muscle pain -signs and symptoms of aseptic meningitis such as stiff neck and sensitivity to light, headache, drowsiness, fever, nausea, vomiting, rash -signs of infection - fever or chills, cough, sore throat, pain or difficulty passing urine -suicidal thoughts or other mood changes -swollen lymph nodes -trouble walking -unusual bruising or bleeding -unusually weak or tired -yellowing of the eyes or skin Side effects that usually do not require medical attention (report to your doctor or health care professional if they continue or are bothersome): -diarrhea -dizziness -dry mouth -stuffy nose -tiredness -tremors -trouble sleeping This list may not describe all possible side effects. Call your doctor for medical advice about side effects. You may report side effects to FDA at 1-800-FDA-1088. Where should I keep my medicine? Keep out of reach of children. Store at room temperature between 15 and 30 degrees C (59 and 86 degrees F). Throw away any unused medicine after the expiration date. NOTE: This sheet is a summary. It may not cover all possible information. If you have questions about this medicine, talk to your doctor, pharmacist, or health care provider.  2019 Elsevier/Gold Standard (2016-11-27 16:07:39)  

## 2018-07-30 NOTE — Progress Notes (Signed)
Virtual Visit via Video Note  I connected with John Stokes on 07/30/18 at  2:00 PM EDT by a video enabled telemedicine application and verified that I am speaking with the correct person using two identifiers.   I discussed the limitations of evaluation and management by telemedicine and the availability of in person appointments. The patient expressed understanding and agreed to proceed.   I discussed the assessment and treatment plan with the patient. The patient was provided an opportunity to ask questions and all were answered. The patient agreed with the plan and demonstrated an understanding of the instructions.   The patient was advised to call back or seek an in-person evaluation if the symptoms worsen or if the condition fails to improve as anticipated.  I provided 15 minutes of non-face-to-face time during this encounter.   John Longs, MD  [facility] MD OP Progress Note  07/30/2018 1:56 PM VU LIEBMAN  MRN:  [id number]  Chief Complaint:  Chief Complaint    Follow-up     HPI: John Stokes is a 28 year old Caucasian male, employed, lives in [place], married, has a history of bipolar type II, works evaluated by telemedicine today.  Patient today reports that he is currently anxious about the COVID-19 crisis.  He reports he works in [employer] and hence is trying to figure out how to cope with it.  He has started psychotherapy sessions with therapist Bart Mccormic.  He has an appointment with him today.  Patient reports he continues to struggle with mood swings.  He reports there are times when he goes into this depressive phases.  He reports the Abilify increased dosage may be helping to some extent.  Discussed adding a mood stabilizer-lamotrigine.  He agrees with plan.  Patient reports sleep is fair.  Patient denies any suicidality, homicidality or perceptual disturbances.  Patient denies any other concerns today.   Visit Diagnosis:    ICD-10-CM   1. Bipolar 2 disorder  (HCC) F31.81 ARIPiprazole (ABILIFY) 15 MG tablet    buPROPion (WELLBUTRIN) 100 MG tablet    lamoTRIgine (LAMICTAL) 25 MG tablet   mild, mixed  2. Insomnia, unspecified type G47.00     Past Psychiatric History: I have reviewed past psychiatric history from my progress note on 04/01/2018.  Past trials of Abilify, Wellbutrin Past Medical History:  Past Medical History:  Diagnosis Date  . ADHD (attention deficit hyperactivity disorder)   . Bipolar disorder (HCC)    History reviewed. No pertinent surgical history.  Family Psychiatric History: Reviewed family psychiatric history from my progress note on 04/01/2028  Family History:  Family History  Problem Relation Age of Onset  . Anxiety disorder Father   . Alcohol abuse Father   . Depression Mother   . Anxiety disorder Sister     Social History: I have reviewed social history from my progress note on 04/01/2018 Social History   Socioeconomic History  . Marital status: Married    Spouse name: John Stokes  . Number of children: 1  . Years of education: Not on file  . Highest education level: High school graduate  Occupational History  . Not on file  Social Needs  . Financial resource strain: Not hard at all  . Food insecurity:    Worry: Never true    Inability: Never true  . Transportation needs:    Medical: No    Non-medical: No  Tobacco Use  . Smoking status: Former Smoker    Types: Cigarettes    Last attempt to  quit: 04/01/2013    Years since quitting: 5.3  . Smokeless tobacco: Never Used  Substance and Sexual Activity  . Alcohol use: No    Alcohol/week: 0.0 standard drinks  . Drug use: Never  . Sexual activity: Yes  Lifestyle  . Physical activity:    Days per week: 0 days    Minutes per session: 0 min  . Stress: Rather much  Relationships  . Social connections:    Talks on phone: Not on file    Gets together: Not on file    Attends religious service: More than 4 times per year    Active member of club or  organization: No    Attends meetings of clubs or organizations: Never    Relationship status: Married  Other Topics Concern  . Not on file  Social History Narrative  . Not on file    Allergies: No Known Allergies  Metabolic Disorder Labs: No results found for: HGBA1C, MPG No results found for: PROLACTIN No results found for: CHOL, TRIG, HDL, CHOLHDL, VLDL, LDLCALC No results found for: TSH  Therapeutic Level Labs: No results found for: LITHIUM No results found for: VALPROATE No components found for:  CBMZ  Current Medications: Current Outpatient Medications  Medication Sig Dispense Refill  . ARIPiprazole (ABILIFY) 15 MG tablet Take 1 tablet (15 mg total) by mouth daily. 30 tablet 1  . buPROPion (WELLBUTRIN) 100 MG tablet Take 1 tablet (100 mg total) by mouth daily. 30 tablet 2  . lamoTRIgine (LAMICTAL) 25 MG tablet Take 1-2 tablets (25-50 mg total) by mouth as directed. Take 1 tablet for 2 weeks and increase to 2 tablets 60 tablet 1   No current facility-administered medications for this visit.      Musculoskeletal: Strength & Muscle Tone: UTA Gait & Station: UTA Patient leans: N/A  Psychiatric Specialty Exam: Review of Systems  Psychiatric/Behavioral: Positive for depression. The patient is nervous/anxious.   All other systems reviewed and are negative.   There were no vitals taken for this visit.There is no height or weight on file to calculate BMI.  General Appearance: Casual  Eye Contact:  Fair  Speech:  Normal Rate  Volume:  Normal  Mood:  Anxious and Dysphoric  Affect:  Congruent  Thought Process:  Goal Directed and Descriptions of Associations: Intact  Orientation:  Full (Time, Place, and Person)  Thought Content: Logical   Suicidal Thoughts:  No  Homicidal Thoughts:  No  Memory:  Immediate;   Fair Recent;   Fair Remote;   Fair  Judgement:  Fair  Insight:  Fair  Psychomotor Activity:  Normal  Concentration:  Concentration: Fair and Attention Span:  Fair  Recall:  [employer] of Knowledge: Fair  Language: Fair  Akathisia:  No  Handed:  Right  AIMS (if indicated): Denies tremors, rigidity, stiffness  Assets:  Communication Skills Desire for Improvement Social Support  ADL's:  Intact  Cognition: WNL  Sleep:  Fair   Screenings: AIMS     [facility] Visit from 04/15/2018 in [facility]  AIMS Total Score  0       Assessment and Plan: Keron is a 28 year old Caucasian male, lives in [place], has a history of bipolar type II, was evaluated by telemedicine.  Patient is biologically predisposed given his family history of mental health problems.  He also has psychosocial stressors of work as well as having a newborn baby.  Patient reports current psychosocial stressors of the COVID-19 crisis.  Patient has  started psychotherapy sessions which are beneficial.  We will continue to make medication changes since he struggles with mood swings.  Plan as noted below.  Plan Bipolar type II-unstable Abilify 15 mg p.o. daily-recently increased. Start Lamictal 25 mg for 2 weeks and increase to 50 mg p.o. daily after that. Wellbutrin at reduced dosage of 100 mg p.o. daily  Insomnia-improving Continue sleep hygiene techniques  Patient to continue psychotherapy sessions- with Mr.Baart McCormick  At [facility].  Pending labs-TSH.  Follow-up in clinic in 4 weeks or sooner if needed.  I have spent atleast 15 minutes non face to face with patient today. More than 50 % of the time was spent for psychoeducation and supportive psychotherapy and care coordination.  This note was generated in part or whole with voice recognition software. Voice recognition is usually quite accurate but there are transcription errors that can and very often do occur. I apologize for any typographical errors that were not detected and corrected.               John LongsSaramma Mykel Mohl, MD 07/30/2018, 1:56 PM

## 2018-09-02 ENCOUNTER — Other Ambulatory Visit: Payer: Self-pay

## 2018-09-02 ENCOUNTER — Encounter: Payer: Self-pay | Admitting: Psychiatry

## 2018-09-02 ENCOUNTER — Ambulatory Visit (INDEPENDENT_AMBULATORY_CARE_PROVIDER_SITE_OTHER): Payer: 59 | Admitting: Psychiatry

## 2018-09-02 ENCOUNTER — Ambulatory Visit: Payer: 59 | Admitting: Psychiatry

## 2018-09-02 DIAGNOSIS — G4700 Insomnia, unspecified: Secondary | ICD-10-CM

## 2018-09-02 DIAGNOSIS — F3181 Bipolar II disorder: Secondary | ICD-10-CM

## 2018-09-02 MED ORDER — ARIPIPRAZOLE 15 MG PO TABS: 15.0000 mg | ORAL_TABLET | Freq: Every day | ORAL | 1 refills | Status: DC

## 2018-09-02 NOTE — Progress Notes (Signed)
Virtual Visit via Video Note  I connected with John Stokes on 09/02/18 at  9:45 AM EDT by a video enabled telemedicine application and verified that I am speaking with the correct person using two identifiers.   I discussed the limitations of evaluation and management by telemedicine and the availability of in person appointments. The patient expressed understanding and agreed to proceed.   I discussed the assessment and treatment plan with the patient. The patient was provided an opportunity to ask questions and all were answered. The patient agreed with the plan and demonstrated an understanding of the instructions.   The patient was advised to call back or seek an in-person evaluation if the symptoms worsen or if the condition fails to improve as anticipated.  BH MD  OP Progress Note  09/02/2018 12:26 PM John Stokes  MRN:  [id number]  Chief Complaint:  Chief Complaint    Follow-up     HPI: John Stokes is a 28 year old Caucasian male, employed, lives in [place], married, has a history of bipolar disorder type II, was evaluated by telemedicine today.  Patient today reports he is currently doing well on the current medication changes.  He takes Lamictal 25 mg.  He reports he felt better on the 25 mg and did not increase it to 50 mg yet.  For now he wants to stay on the 25 mg daily.  He reports his mood swings are more under control.  He reports sleep is good.  He reports appetite is fair.  He reports work continues to be stressful due to the current COVID-19 crisis.  He however has been managing okay.  He reports he continues to go to [facility] for therapy sessions but was told that it cannot be continued for a long time.  He hence wants to establish care with therapist here in clinic.  Will refer him to therapist here.  Patient denies any suicidality, homicidality or perceptual disturbances. Visit Diagnosis:    ICD-10-CM   1. Bipolar 2 disorder (HCC) F31.81 ARIPiprazole (ABILIFY) 15 MG  tablet   mild, mixed  2. Insomnia, unspecified type G47.00     Past Psychiatric History: I have reviewed past psychiatric history from my progress note on 04/01/2018.  Past trials of Abilify, Wellbutrin.  Past Medical History:  Past Medical History:  Diagnosis Date  . ADHD (attention deficit hyperactivity disorder)   . Bipolar disorder (HCC)    History reviewed. No pertinent surgical history.  Family Psychiatric History: I have reviewed family psychiatric history from my progress note on 04/01/2018.  Family History:  Family History  Problem Relation Age of Onset  . Anxiety disorder Father   . Alcohol abuse Father   . Depression Mother   . Anxiety disorder Sister     Social History: Reviewed social history from my progress note on 04/01/2018. Social History   Socioeconomic History  . Marital status: Married    Spouse name: makayla  . Number of children: 1  . Years of education: Not on file  . Highest education level: High school graduate  Occupational History  . Not on file  Social Needs  . Financial resource strain: Not hard at all  . Food insecurity:    Worry: Never true    Inability: Never true  . Transportation needs:    Medical: No    Non-medical: No  Tobacco Use  . Smoking status: Former Smoker    Types: Cigarettes    Last attempt to quit: 04/01/2013  Years since quitting: 5.4  . Smokeless tobacco: Never Used  Substance and Sexual Activity  . Alcohol use: No    Alcohol/week: 0.0 standard drinks  . Drug use: Never  . Sexual activity: Yes  Lifestyle  . Physical activity:    Days per week: 0 days    Minutes per session: 0 min  . Stress: Rather much  Relationships  . Social connections:    Talks on phone: Not on file    Gets together: Not on file    Attends religious service: More than 4 times per year    Active member of club or organization: No    Attends meetings of clubs or organizations: Never    Relationship status: Married  Other Topics  Concern  . Not on file  Social History Narrative  . Not on file    Allergies: No Known Allergies  Metabolic Disorder Labs: No results found for: HGBA1C, MPG No results found for: PROLACTIN No results found for: CHOL, TRIG, HDL, CHOLHDL, VLDL, LDLCALC No results found for: TSH  Therapeutic Level Labs: No results found for: LITHIUM No results found for: VALPROATE No components found for:  CBMZ  Current Medications: Current Outpatient Medications  Medication Sig Dispense Refill  . ARIPiprazole (ABILIFY) 15 MG tablet Take 1 tablet (15 mg total) by mouth daily. 30 tablet 1  . buPROPion (WELLBUTRIN) 100 MG tablet Take 1 tablet (100 mg total) by mouth daily. 30 tablet 2  . lamoTRIgine (LAMICTAL) 25 MG tablet Take 1-2 tablets (25-50 mg total) by mouth as directed. Take 1 tablet for 2 weeks and increase to 2 tablets 60 tablet 1   No current facility-administered medications for this visit.      Musculoskeletal: Strength & Muscle Tone: within normal limits Gait & Station: normal Patient leans: N/A  Psychiatric Specialty Exam: Review of Systems  Psychiatric/Behavioral: Negative for depression. The patient is not nervous/anxious.   All other systems reviewed and are negative.   There were no vitals taken for this visit.There is no height or weight on file to calculate BMI.  General Appearance: Casual  Eye Contact:  Fair  Speech:  Clear and Coherent  Volume:  Normal  Mood:  Euthymic  Affect:  Congruent  Thought Process:  Goal Directed and Descriptions of Associations: Intact  Orientation:  Full (Time, Place, and Person)  Thought Content: WDL   Suicidal Thoughts:  No  Homicidal Thoughts:  No  Memory:  Immediate;   Fair Recent;   Fair Remote;   Fair  Judgement:  Fair  Insight:  Fair  Psychomotor Activity:  Normal  Concentration:  Concentration: Fair and Attention Span: Fair  Recall:  FiservFair  Fund of Knowledge: Fair  Language: Fair  Akathisia:  No  Handed:  Right  AIMS  (if indicated): Denies tremors, rigidity, stiffness  Assets:  Communication Skills Desire for Improvement Financial Resources/Insurance Housing Intimacy Social Support Talents/Skills Transportation Vocational/Educational  ADL's:  Intact  Cognition: WNL  Sleep:  Fair   Screenings: AIMS     [facility] Visit from [facility]/15/2018 [facility]  AIMS Total Score  0       Assessment and Plan: John Stokes is a 28 year old Caucasian male, lives in [place], has a history of bipolar disorder type II was evaluated by telemedicine.  Patient is biologically predisposed given his family history of mental health problems.  He also has psychosocial stressors of work as well as having a newborn baby.  He however is making progress on the  current medication regimen.  Plan as noted below.  Plan Bipolar disorder type II-improving Abilify 15 mg p.o. daily. Lamictal 25 mg p.o. daily. Wellbutrin at reduced dosage of 100 mg p.o. daily  For insomnia-improving Continue sleep hygiene techniques.  We will refer him to therapist here in clinic.  He currently sees a therapist at [facility].  Follow-up in clinic in 6 weeks or sooner if needed.  Appointment scheduled for June 25 at 2:30 PM.  I have spent atleast 15 minutes non  face to face with patient today. More than 50 % of the time was spent for psychoeducation and supportive psychotherapy and care coordination.  This note was generated in part or whole with voice recognition software. Voice recognition is usually quite accurate but there are transcription errors that can and very often do occur. I apologize for any typographical errors that were not detected and corrected.        Jomarie Longs, MD 09/02/2018, 12:26 PM

## 2018-09-13 ENCOUNTER — Ambulatory Visit (INDEPENDENT_AMBULATORY_CARE_PROVIDER_SITE_OTHER): Payer: 59 | Admitting: Licensed Clinical Social Worker

## 2018-09-13 ENCOUNTER — Other Ambulatory Visit: Payer: Self-pay

## 2018-09-13 DIAGNOSIS — F3181 Bipolar II disorder: Secondary | ICD-10-CM

## 2018-09-18 NOTE — Progress Notes (Signed)
[facility] Initial Clinical Assessment  MRN: [id number] NAME: John Stokes Date: 09/13/18  Total time: 1 hour  Type of Contact:  virtual Patient consent obtained:  yes Reason for Visit today:  establish care  Treatment History Patient recently received Inpatient Treatment:  yes  Facility/Program:  [facility]  Date of discharge:  unknown Patient currently being seen by therapist/psychiatrist:  Dr. Elna Breslow Patient currently receiving the following services:    Past Psychiatric History/Hospitalization(s): Anxiety: Yes Bipolar Disorder: Yes Depression: Yes Mania: Yes Psychosis: No Schizophrenia: No Personality Disorder: No Hospitalization for psychiatric illness: No History of Electroconvulsive Shock Therapy: No Prior Suicide Attempts: No  Clinical Assessment:  PHQ-9 Assessments: No flowsheet data found.  GAD-7 Assessments: No flowsheet data found.    Stress Current stressors:  birth of daughter 10 months ago Familial stressors:  financial, birth of daughter Sleep:  cycling Appetite:  cycling Coping ability:  poor Patient taking medications as prescribed:    Current medications:  Outpatient Encounter Medications as of 09/13/2018  Medication Sig  . ARIPiprazole (ABILIFY) 15 MG tablet Take 1 tablet (15 mg total) by mouth daily.  Marland Kitchen buPROPion (WELLBUTRIN) 100 MG tablet Take 1 tablet (100 mg total) by mouth daily.  Marland Kitchen lamoTRIgine (LAMICTAL) 25 MG tablet Take 1-2 tablets (25-50 mg total) by mouth as directed. Take 1 tablet for 2 weeks and increase to 2 tablets   No facility-administered encounter medications on file as of 09/13/2018.     Self-harm Behaviors Risk Assessment Self-harm risk factors:   Patient endorses recent thoughts of harming self:    [place] Suicide Severity Rating Scale: No flowsheet data found.  Danger to Others Risk Assessment Danger to others risk factors:   Patient endorses recent thoughts of harming others:    Dynamic  Appraisal of Situational Aggression (DASA): No flowsheet data found.  Substance Use Assessment Patient recently consumed alcohol:    Alcohol Use Disorder Identification Test (AUDIT): No flowsheet data found. Patient recently used drugs:    Opioid Risk Assessment:  Patient is concerned about dependence or abuse of substances:    ASAM Multidimensional Assessment Summary:  Dimension 1:    Dimension 1 Rating:    Dimension 2:    Dimension 2 Rating:    Dimension 3:    Dimension 3 Rating:    Dimension 4:    Dimension 4 Rating:    Dimension 5:    Dimension 5 Rating:    Dimension 6:    Dimension 6 Rating:   ASAM's Severity Rating Score:   ASAM Recommended Level of Treatment:     Goals, Interventions and Follow-up Plan Goals: Increase healthy adjustment to current life circumstances Interventions: Motivational Interviewing and Brief CBT Follow-up Plan: Refer to [facility] Outpatient Therapy  Summary of Clinical Assessment Summary: John Stokes is a 28 year old male, employed, married, has a history of bipolar disorder type II.  He reports that he has been hospitalized two times since age 75.  Patient reports his symptoms were stable up until few months ago on Abilify and Wellbutrin.  He used to be on Abilify 10 mg previously which was tapered down to 2.5 mg in the past.  Patient reports he has new psychosocial stressors including the birth of his daughter in July.  He reports he has to take care of the child as well as has some financial issues which is triggering his current mood symptoms   Marinda Elk, LCSW

## 2018-10-10 ENCOUNTER — Other Ambulatory Visit: Payer: Self-pay

## 2018-10-10 ENCOUNTER — Ambulatory Visit: Payer: 59 | Admitting: Licensed Clinical Social Worker

## 2018-10-17 ENCOUNTER — Other Ambulatory Visit: Payer: Self-pay

## 2018-10-17 ENCOUNTER — Ambulatory Visit (INDEPENDENT_AMBULATORY_CARE_PROVIDER_SITE_OTHER): Payer: 59 | Admitting: Psychiatry

## 2018-10-17 ENCOUNTER — Encounter: Payer: Self-pay | Admitting: Psychiatry

## 2018-10-17 DIAGNOSIS — F3181 Bipolar II disorder: Secondary | ICD-10-CM | POA: Insufficient documentation

## 2018-10-17 DIAGNOSIS — F5105 Insomnia due to other mental disorder: Secondary | ICD-10-CM | POA: Insufficient documentation

## 2018-10-17 MED ORDER — LAMOTRIGINE 25 MG PO TABS: 25.0000 mg | ORAL_TABLET | Freq: Every day | ORAL | 1 refills | Status: DC

## 2018-10-17 MED ORDER — BUPROPION HCL 100 MG PO TABS: 100.0000 mg | ORAL_TABLET | Freq: Every day | ORAL | 2 refills | Status: DC

## 2018-10-17 MED ORDER — ARIPIPRAZOLE 15 MG PO TABS: 15.0000 mg | ORAL_TABLET | Freq: Every day | ORAL | 2 refills | Status: DC

## 2018-10-17 NOTE — Progress Notes (Signed)
Virtual Visit via Video Note  I connected with John Stokes on 10/17/18 at  2:30 PM EDT by a video enabled telemedicine application and verified that I am speaking with the correct person using two identifiers.   I discussed the limitations of evaluation and management by telemedicine and the availability of in person appointments. The patient expressed understanding and agreed to proceed.     I discussed the assessment and treatment plan with the patient. The patient was provided an opportunity to ask questions and all were answered. The patient agreed with the plan and demonstrated an understanding of the instructions.   The patient was advised to call back or seek an in-person evaluation if the symptoms worsen or if the condition fails to improve as anticipated.   BH MD OP Progress Note  10/17/2018 6:30 PM John ShaggyKyle L Stokes  MRN:  [id number]  Chief Complaint:  Chief Complaint    Follow-up     HPI: John Stokes is a 28 year old Caucasian male, employed, lives in [place], married, has a history of bipolar disorder type II was evaluated by telemedicine today.  Patient today reports he is currently doing well on the current medications with regards to his mood symptoms.  He however reports the Lamictal continues to make him tired.  He has been taking it in the morning since he forgets to take it in the evening.  He reports he had a bad day yesterday other than that so far it is going well.  Patient reports work is going well.  He continues to have good support system from his wife.  He has upcoming appointment with his therapist and reports it is going well.  Patient denies any suicidality, homicidality or perceptual disturbances. Visit Diagnosis:    ICD-10-CM   1. Bipolar 2 disorder (HCC)  F31.81 ARIPiprazole (ABILIFY) 15 MG tablet    buPROPion (WELLBUTRIN) 100 MG tablet    lamoTRIgine (LAMICTAL) 25 MG tablet   mild, mixed  2. Insomnia due to mental disorder  F51.05     Past  Psychiatric History: I have reviewed past psychiatric history from my progress note on 04/01/2018.  Past trials of Abilify, Wellbutrin  Past Medical History:  Past Medical History:  Diagnosis Date  . ADHD (attention deficit hyperactivity disorder)   . Bipolar disorder (HCC)    History reviewed. No pertinent surgical history.  Family Psychiatric History: I have reviewed family psychiatric history from my progress note on 04/01/2018.  Family History:  Family History  Problem Relation Age of Onset  . Anxiety disorder Father   . Alcohol abuse Father   . Depression Mother   . Anxiety disorder Sister     Social History: I have reviewed social history from my progress note on 04/01/2018. Social History   Socioeconomic History  . Marital status: Married    Spouse name: makayla  . Number of children: 1  . Years of education: Not on file  . Highest education level: High school graduate  Occupational History  . Not on file  Social Needs  . Financial resource strain: Not hard at all  . Food insecurity    Worry: Never true    Inability: Never true  . Transportation needs    Medical: No    Non-medical: No  Tobacco Use  . Smoking status: Former Smoker    Types: Cigarettes    Quit date: 04/01/2013    Years since quitting: 5.5  . Smokeless tobacco: Never Used  Substance and Sexual Activity  .  Alcohol use: No    Alcohol/week: 0.0 standard drinks  . Drug use: Never  . Sexual activity: Yes  Lifestyle  . Physical activity    Days per week: 0 days    Minutes per session: 0 min  . Stress: Rather much  Relationships  . Social Musicianconnections    Talks on phone: Not on file    Gets together: Not on file    Attends religious service: More than 4 times per year    Active member of club or organization: No    Attends meetings of clubs or organizations: Never    Relationship status: Married  Other Topics Concern  . Not on file  Social History Narrative  . Not on file    Allergies: No  Known Allergies  Metabolic Disorder Labs: No results found for: HGBA1C, MPG No results found for: PROLACTIN No results found for: CHOL, TRIG, HDL, CHOLHDL, VLDL, LDLCALC No results found for: TSH  Therapeutic Level Labs: No results found for: LITHIUM No results found for: VALPROATE No components found for:  CBMZ  Current Medications: Current Outpatient Medications  Medication Sig Dispense Refill  . ARIPiprazole (ABILIFY) 15 MG tablet Take 1 tablet (15 mg total) by mouth daily. 30 tablet 2  . buPROPion (WELLBUTRIN) 100 MG tablet Take 1 tablet (100 mg total) by mouth daily. 30 tablet 2  . lamoTRIgine (LAMICTAL) 25 MG tablet Take 1 tablet (25 mg total) by mouth at bedtime. 30 tablet 1   No current facility-administered medications for this visit.      Musculoskeletal: Strength & Muscle Tone: within normal limits Gait & Station: normal Patient leans: N/A  Psychiatric Specialty Exam: Review of Systems  Psychiatric/Behavioral: The patient is nervous/anxious.   All other systems reviewed and are negative.   There were no vitals taken for this visit.There is no height or weight on file to calculate BMI.  General Appearance: Casual  Eye Contact:  Fair  Speech:  Clear and Coherent  Volume:  Normal  Mood:  Anxious  Affect:  Appropriate  Thought Process:  Goal Directed and Descriptions of Associations: Intact  Orientation:  Full (Time, Place, and Person)  Thought Content: Logical   Suicidal Thoughts:  No  Homicidal Thoughts:  No  Memory:  Immediate;   Fair Recent;   Fair Remote;   Fair  Judgement:  Fair  Insight:  Fair  Psychomotor Activity:  Normal  Concentration:  Concentration: Fair and Attention Span: Fair  Recall:  FiservFair  Fund of Knowledge: Fair  Language: Fair  Akathisia:  No  Handed:  Right  AIMS (if indicated): Denies tremors, rigidity, stiffness  Assets:  Communication Skills Desire for Improvement Housing Social Support  ADL's:  Intact  Cognition: WNL   Sleep:  Fair   Screenings: AIMS     [facility] Visit from [facility]/15/2018 in [facility]  AIMS Total Score  0       Assessment and Plan: John Stokes is a 28 year old Caucasian male, lives in [place], has a history of bipolar disorder type II was evaluated by telemedicine today.  Patient is biologically predisposed given his history of mental health problems.  He also has psychosocial stressors of work as well as having a young child at home.  Patient continues to struggle with some side effects to medications.  He will continue to need medication management as well as psychotherapy sessions.  Plan Bipolar disorder type II-improving Discussed with patient to start taking Lamictal 25 mg at bedtime. Abilify 15 mg  p.o. daily Wellbutrin at reduced dosage of 100 mg p.o. daily  For insomnia-improving Continue sleep hygiene techniques.  Patient to continue psychotherapy sessions with Ms. Peacock.  Patient to follow-up in clinic in 1 month.  He will call the clinic back if he continues to have problems with the lamotrigine.  Appointment scheduled for August 3 at 2:45 PM.  I have spent atleast 15 minutes non face to face with patient today. More than 50 % of the time was spent for psychoeducation and supportive psychotherapy and care coordination.  This note was generated in part or whole with voice recognition software. Voice recognition is usually quite accurate but there are transcription errors that can and very often do occur. I apologize for any typographical errors that were not detected and corrected.        Ursula Alert, MD 10/17/2018, 6:30 PM

## 2018-11-06 ENCOUNTER — Other Ambulatory Visit: Payer: Self-pay

## 2018-11-06 ENCOUNTER — Ambulatory Visit: Payer: 59 | Admitting: Licensed Clinical Social Worker

## 2018-11-25 ENCOUNTER — Other Ambulatory Visit: Payer: Self-pay

## 2018-11-25 ENCOUNTER — Ambulatory Visit (INDEPENDENT_AMBULATORY_CARE_PROVIDER_SITE_OTHER): Payer: 59 | Admitting: Psychiatry

## 2018-11-25 ENCOUNTER — Encounter: Payer: Self-pay | Admitting: Psychiatry

## 2018-11-25 DIAGNOSIS — F3181 Bipolar II disorder: Secondary | ICD-10-CM

## 2018-11-25 DIAGNOSIS — F5105 Insomnia due to other mental disorder: Secondary | ICD-10-CM

## 2018-11-25 MED ORDER — LAMOTRIGINE 25 MG PO TABS: 50.0000 mg | ORAL_TABLET | Freq: Every day | ORAL | 1 refills | Status: DC

## 2018-11-25 NOTE — Progress Notes (Signed)
Virtual Visit via Video Note  I connected with John Stokes on 11/25/18 at  2:45 PM EDT by a video enabled telemedicine application and verified that I am speaking with the correct person using two identifiers.   I discussed the limitations of evaluation and management by telemedicine and the availability of in person appointments. The patient expressed understanding and agreed to proceed.   I discussed the assessment and treatment plan with the patient. The patient was provided an opportunity to ask questions and all were answered. The patient agreed with the plan and demonstrated an understanding of the instructions.   The patient was advised to call back or seek an in-person evaluation if the symptoms worsen or if the condition fails to improve as anticipated.   [facility] MD OP Progress Note  11/25/2018 5:21 PM John Stokes  MRN:  [id number]  Chief Complaint:  Chief Complaint    Follow-up; Depression     HPI: Colbert is a 28 year old Caucasian male, employed, lives in [place], married, has a history of bipolar disorder type II, insomnia, was evaluated by telemedicine today.  Patient today reports he continues to struggle with some depressive symptoms.  He reports his mood consistently has been down since the past few weeks.  He reports he does have the psychosocial stressors of the COVID-19 going on.  He reports his work has slowed down and he is being asked to do other jobs at his workplace at this time.  He reports even that is not a lot anymore.  He reports he also worries about the fact that he is not a very good dad and feels like he has to work on that.  He does not know why he feels that way however feels like he does not feel like connection to his biological child.  Patient reports he has not been in psychotherapy in a while.  He reports he does talk to someone who is a friend which helps him.  He is compliant on his medications as prescribed.  He denies any side  effects.  Discussed readjusting his lamotrigine to a higher dosage.  Also discussed starting psychotherapy sessions again.  He agrees with plan.  Patient denies any suicidality, homicidality or perceptual disturbances. Visit Diagnosis:    ICD-10-CM   1. Bipolar 2 disorder (HCC)  F31.81 lamoTRIgine (LAMICTAL) 25 MG tablet   mild, mixed  2. Insomnia due to mental disorder  F51.05     Past Psychiatric History: I have reviewed past psychiatric history from my progress note on 04/01/2018.  Past trials of Abilify, Wellbutrin.  Past Medical History:  Past Medical History:  Diagnosis Date  . ADHD (attention deficit hyperactivity disorder)   . Bipolar disorder ([place])    History reviewed. No pertinent surgical history.  Family Psychiatric History: I have reviewed family psychiatric history from my progress note on 04/01/2018.  Family History:  Family History  Problem Relation Age of Onset  . Anxiety disorder Father   . Alcohol abuse Father   . Depression Mother   . Anxiety disorder Sister     Social History: I have reviewed social history from my progress note on 04/01/2018. Social History   Socioeconomic History  . Marital status: Married    Spouse name: makayla  . Number of children: 1  . Years of education: Not on file  . Highest education level: High school graduate  Occupational History  . Not on file  Social Needs  . Financial resource strain: Not hard at  all  . Food insecurity    Worry: Never true    Inability: Never true  . Transportation needs    Medical: No    Non-medical: No  Tobacco Use  . Smoking status: Former Smoker    Types: Cigarettes    Quit date: 04/01/2013    Years since quitting: 5.6  . Smokeless tobacco: Never Used  Substance and Sexual Activity  . Alcohol use: No    Alcohol/week: 0.0 standard drinks  . Drug use: Never  . Sexual activity: Yes  Lifestyle  . Physical activity    Days per week: 0 days    Minutes per session: 0 min  . Stress:  Rather much  Relationships  . Social Musicianconnections    Talks on phone: Not on file    Gets together: Not on file    Attends religious service: More than 4 times per year    Active member of club or organization: No    Attends meetings of clubs or organizations: Never    Relationship status: Married  Other Topics Concern  . Not on file  Social History Narrative  . Not on file    Allergies: No Known Allergies  Metabolic Disorder Labs: No results found for: HGBA1C, MPG No results found for: PROLACTIN No results found for: CHOL, TRIG, HDL, CHOLHDL, VLDL, LDLCALC No results found for: TSH  Therapeutic Level Labs: No results found for: LITHIUM No results found for: VALPROATE No components found for:  CBMZ  Current Medications: Current Outpatient Medications  Medication Sig Dispense Refill  . ARIPiprazole (ABILIFY) 15 MG tablet Take 1 tablet (15 mg total) by mouth daily. 30 tablet 2  . buPROPion (WELLBUTRIN) 100 MG tablet Take 1 tablet (100 mg total) by mouth daily. 30 tablet 2  . lamoTRIgine (LAMICTAL) 25 MG tablet Take 2 tablets (50 mg total) by mouth at bedtime. 60 tablet 1   No current facility-administered medications for this visit.      Musculoskeletal: Strength & Muscle Tone: UTA Gait & Station: normal Patient leans: N/A  Psychiatric Specialty Exam: Review of Systems  Psychiatric/Behavioral: Positive for depression.  All other systems reviewed and are negative.   There were no vitals taken for this visit.There is no height or weight on file to calculate BMI.  General Appearance: Casual  Eye Contact:  Fair  Speech:  Clear and Coherent  Volume:  Normal  Mood:  Depressed  Affect:  Congruent  Thought Process:  Goal Directed and Descriptions of Associations: Intact  Orientation:  Full (Time, Place, and Person)  Thought Content: Logical   Suicidal Thoughts:  No  Homicidal Thoughts:  No  Memory:  Immediate;   Fair Recent;   Fair Remote;   Fair  Judgement:  Fair   Insight:  Fair  Psychomotor Activity:  Normal  Concentration:  Concentration: Fair and Attention Span: Fair  Recall:  [employer]Fair  Fund of Knowledge: Fair  Language: Fair  Akathisia:  No  Handed:  Right  AIMS (if indicated): Denies tremors, rigidity  Assets:  Communication Skills Desire for Improvement Social Support  ADL's:  Intact  Cognition: WNL  Sleep:  Fair   Screenings: AIMS     [facility]/2018 [facility]  AIMS Total Score  0       Assessment and Plan: John Stokes is a 28 year old Caucasian male, lives in [place], has a history of bipolar disorder type II was evaluated by telemedicine today.  Patient is biologically predisposed given his history  of mental health problems.  He also has psychosocial stressors of work as well as having a young child at home.  Patient continues to struggle with depressive symptoms and will benefit from psychotherapy as well as medication readjustment.  Plan as noted below.  Plan  Bipolar disorder type II- some progress Increase lamotrigine to 50 mg p.o. nightly. Abilify 15 mg p.o. daily Wellbutrin 100 mg p.o. daily-reduced dosage  For insomnia-improving Continue sleep hygiene techniques  I have coordinated care with staff to schedule patient back for psychotherapy sessions with Ms. Peacock.  Follow-up in clinic in 4 weeks or sooner if needed.  September 16 at 4:30 PM  I have spent atleast 15 minutes non face to face with patient today. More than 50 % of the time was spent for psychoeducation and supportive psychotherapy and care coordination.  This note was generated in part or whole with voice recognition software. Voice recognition is usually quite accurate but there are transcription errors that can and very often do occur. I apologize for any typographical errors that were not detected and corrected.       Jomarie LongsSaramma Nataly Pacifico, MD 11/25/2018, 5:21 PM

## 2018-12-23 ENCOUNTER — Telehealth: Payer: Self-pay

## 2018-12-23 ENCOUNTER — Ambulatory Visit: Payer: 59 | Admitting: Licensed Clinical Social Worker

## 2018-12-23 DIAGNOSIS — F3181 Bipolar II disorder: Secondary | ICD-10-CM

## 2018-12-23 MED ORDER — BUPROPION HCL ER (XL) 150 MG PO TB24: 150.0000 mg | ORAL_TABLET | Freq: Every day | ORAL | 1 refills | Status: DC

## 2018-12-23 NOTE — Telephone Encounter (Signed)
Patient reports he wants to restart wellbutrin xl 150 mg since he is very tired and lamictal is possibly making it worse.  Stop Lamictal Start wellbutrin XL  150 mg

## 2018-12-23 NOTE — Telephone Encounter (Signed)
pt called states he feels he needs to increase his medication. Pt has appt next month but would like to see if an adjustment can be done before the appt

## 2019-01-08 ENCOUNTER — Other Ambulatory Visit: Payer: Self-pay

## 2019-01-08 ENCOUNTER — Encounter: Payer: Self-pay | Admitting: Psychiatry

## 2019-01-08 ENCOUNTER — Ambulatory Visit (INDEPENDENT_AMBULATORY_CARE_PROVIDER_SITE_OTHER): Payer: 59 | Admitting: Psychiatry

## 2019-01-08 DIAGNOSIS — Z9189 Other specified personal risk factors, not elsewhere classified: Secondary | ICD-10-CM

## 2019-01-08 DIAGNOSIS — F5105 Insomnia due to other mental disorder: Secondary | ICD-10-CM

## 2019-01-08 DIAGNOSIS — Z79899 Other long term (current) drug therapy: Secondary | ICD-10-CM | POA: Insufficient documentation

## 2019-01-08 DIAGNOSIS — F3181 Bipolar II disorder: Secondary | ICD-10-CM | POA: Diagnosis not present

## 2019-01-08 MED ORDER — ARIPIPRAZOLE 15 MG PO TABS: 15.0000 mg | ORAL_TABLET | Freq: Every day | ORAL | 2 refills | Status: DC

## 2019-01-08 NOTE — Progress Notes (Signed)
Virtual Visit via Video Note  I connected with John Stokes on 01/08/19 at  4:30 PM EDT by a video enabled telemedicine application and verified that I am speaking with the correct person using two identifiers.   I discussed the limitations of evaluation and management by telemedicine and the availability of in person appointments. The patient expressed understanding and agreed to proceed.   I discussed the assessment and treatment plan with the patient. The patient was provided an opportunity to ask questions and all were answered. The patient agreed with the plan and demonstrated an understanding of the instructions.   The patient was advised to call back or seek an in-person evaluation if the symptoms worsen or if the condition fails to improve as anticipated.   [facility] MD OP Progress Note  01/08/2019 5:05 PM John Stokes  MRN:  [id number]  Chief Complaint:  Chief Complaint    Follow-up     HPI: John Stokes is a 28 year old Caucasian male, employed, lives in [place], married, has a history of bipolar disorder type II, insomnia was evaluated by telemedicine today.  Patient today reports he is currently working a new position at his job.  He reports it is currently going well.  He reports that Wellbutrin increase has definitely helped his mood.  He denies any significant mood lability.  He reports sleep is good.  Discussed with patient about getting his metabolic panel as well as EKG done since he is on Abilify.  We will print out labs slip and mailed to patient today.  He agrees to get it done from his primary care provider.  Patient denies any suicidality, homicidality or perceptual disturbances.  Patient denies any other concerns today.   Visit Diagnosis:    ICD-10-CM   1. Bipolar 2 disorder (HCC)  F31.81    mild ,mixed  2. Insomnia due to mental disorder  F51.05 ARIPiprazole (ABILIFY) 15 MG tablet  3. High risk medication use  Z79.899 Lipid panel    TSH    Hemoglobin A1C     Prolactin  4. At risk for long QT syndrome  Z91.89 EKG 12-Lead    Past Psychiatric History: I have reviewed past psychiatric history from my progress note on 04/01/2018.  Past trials of Abilify, Wellbutrin  Past Medical History:  Past Medical History:  Diagnosis Date  . ADHD (attention deficit hyperactivity disorder)   . Bipolar disorder (HCC)    History reviewed. No pertinent surgical history.  Family Psychiatric History: I have reviewed family psychiatric history from my progress note on 04/01/2018  Family History:  Family History  Problem Relation Age of Onset  . Anxiety disorder Father   . Alcohol abuse Father   . Depression Mother   . Anxiety disorder Sister     Social History: I have reviewed social history from my progress note on 04/01/2018 Social History   Socioeconomic History  . Marital status: Married    Spouse name: makayla  . Number of children: 1  . Years of education: Not on file  . Highest education level: High school graduate  Occupational History  . Not on file  Social Needs  . Financial resource strain: Not hard at all  . Food insecurity    Worry: Never true    Inability: Never true  . Transportation needs    Medical: No    Non-medical: No  Tobacco Use  . Smoking status: Former Smoker    Types: Cigarettes    Quit date: 04/01/2013  Years since quitting: 5.7  . Smokeless tobacco: Never Used  Substance and Sexual Activity  . Alcohol use: No    Alcohol/week: 0.0 standard drinks  . Drug use: Never  . Sexual activity: Yes  Lifestyle  . Physical activity    Days per week: 0 days    Minutes per session: 0 min  . Stress: Rather much  Relationships  . Social Herbalist on phone: Not on file    Gets together: Not on file    Attends religious service: More than 4 times per year    Active member of club or organization: No    Attends meetings of clubs or organizations: Never    Relationship status: Married  Other Topics Concern  . Not  on file  Social History Narrative  . Not on file    Allergies: No Known Allergies  Metabolic Disorder Labs: No results found for: HGBA1C, MPG No results found for: PROLACTIN No results found for: CHOL, TRIG, HDL, CHOLHDL, VLDL, LDLCALC No results found for: TSH  Therapeutic Level Labs: No results found for: LITHIUM No results found for: VALPROATE No components found for:  CBMZ  Current Medications: Current Outpatient Medications  Medication Sig Dispense Refill  . ARIPiprazole (ABILIFY) 15 MG tablet Take 1 tablet (15 mg total) by mouth daily. 30 tablet 2  . buPROPion (WELLBUTRIN XL) 150 MG 24 hr tablet Take 1 tablet (150 mg total) by mouth daily. 30 tablet 1   No current facility-administered medications for this visit.      Musculoskeletal: Strength & Muscle Tone: UTA Gait & Station: normal Patient leans: N/A  Psychiatric Specialty Exam: Review of Systems  Psychiatric/Behavioral: Negative for depression, hallucinations, substance abuse and suicidal ideas. The patient is not nervous/anxious.   All other systems reviewed and are negative.   There were no vitals taken for this visit.There is no height or weight on file to calculate BMI.  General Appearance: Casual  Eye Contact:  Good  Speech:  Normal Rate  Volume:  Normal  Mood:  Euthymic  Affect:  Appropriate  Thought Process:  Goal Directed and Descriptions of Associations: Intact  Orientation:  Full (Time, Place, and Person)  Thought Content: Logical   Suicidal Thoughts:  No  Homicidal Thoughts:  No  Memory:  Immediate;   Fair Recent;   Fair Remote;   Fair  Judgement:  Fair  Insight:  Fair  Psychomotor Activity:  Normal  Concentration:  Concentration: Fair and Attention Span: Fair  Recall:  [employer] of Knowledge: Fair  Language: Fair  Akathisia:  No  Handed:  Right  AIMS (if indicated): Denies tremors, rigidity  Assets:  Communication Skills Desire for Improvement Social Support  ADL's:  Intact   Cognition: WNL  Sleep:  Fair   Screenings: AIMS     Office Visit from 04/15/2018 in [facility] Total Score  0       Assessment and Plan: John Stokes is a 28 year old Caucasian male, lives in [place], has a history of bipolar disorder type II was evaluated by telemedicine today.  Patient is biologically predisposed given his history of mental health problems, he also has psychosocial stressors of work as well as having a young child at home.  Patient is currently making progress.  Plan as noted below  Plan Bipolar disorder type II- improving Abilify 15 mg p.o. daily Wellbutrin XL 150 mg p.o. daily  For insomnia-continue sleep hygiene techniques  We will order the  following labs-TSH, lipid panel, hemoglobin A1c, prolactin since he is on Abilify.  We will order EKG since he is on Abilify to monitor his QTC.  Follow-up in clinic in 2 months or sooner if needed.  November 30 at 4:30 PM  I have spent atleast 15 minutes non  face to face with patient today. More than 50 % of the time was spent for psychoeducation and supportive psychotherapy and care coordination. This note was generated in part or whole with voice recognition software. Voice recognition is usually quite accurate but there are transcription errors that can and very often do occur. I apologize for any typographical errors that were not detected and corrected.       John LongsSaramma Raquelle Pietro, MD 01/08/2019, 5:05 PM

## 2019-01-20 ENCOUNTER — Telehealth: Payer: Self-pay | Admitting: Psychiatry

## 2019-01-20 NOTE — Telephone Encounter (Signed)
Attempted to call patient after reviewing his EKG.  Received EKG from [facility]. It states provider recommends cardiology referral.  EKG shows left atrial and enlargement, incomplete right bundle branch block.  Return call to patient to discuss.  However no voicemail set up to leave message.

## 2019-03-05 ENCOUNTER — Telehealth: Payer: Self-pay

## 2019-03-05 DIAGNOSIS — F3181 Bipolar II disorder: Secondary | ICD-10-CM

## 2019-03-05 MED ORDER — BUPROPION HCL ER (XL) 150 MG PO TB24: 150.0000 mg | ORAL_TABLET | Freq: Every day | ORAL | 1 refills | Status: DC

## 2019-03-05 NOTE — Telephone Encounter (Signed)
Sent Wellbutrin to pharmacy 

## 2019-03-05 NOTE — Telephone Encounter (Signed)
Received fax requesting a refill on the    buPROPion (WELLBUTRIN XL) 150 MG 24 hr tablet Medication Date: 12/23/2018 Department: [facility] Ordering/Authorizing: Ursula Alert, MD  Order Providers  Prescribing Provider Encounter Provider  Ursula Alert, MD Carlynn Purl, CMA  Outpatient Medication Detail   Disp Refills Start End   buPROPion (WELLBUTRIN XL) 150 MG 24 hr tablet 30 tablet 1 12/23/2018    Sig - Route: Take 1 tablet (150 mg total) by mouth daily. - Oral   Sent to pharmacy as: buPROPion (WELLBUTRIN XL) 150 MG 24 hr tablet   E-Prescribing Status: Receipt confirmed by pharmacy (12/23/2018 1:43 PM EDT)   Associated Diagnoses  Bipolar 2 disorder [facility]) - [place], [place]

## 2019-03-23 NOTE — Progress Notes (Signed)
Virtual Visit via Video Note  I connected with John Stokes on 03/24/19 at  4:30 PM EST by a video enabled telemedicine application and verified that I am speaking with the correct person using two identifiers.   I discussed the limitations of evaluation and management by telemedicine and the availability of in person appointments. The patient expressed understanding and agreed to proceed.     I discussed the assessment and treatment plan with the patient. The patient was provided an opportunity to ask questions and all were answered. The patient agreed with the plan and demonstrated an understanding of the instructions.   The patient was advised to call back or seek an in-person evaluation if the symptoms worsen or if the condition fails to improve as anticipated.  BH MD OP Progress Note  03/24/2019 5:22 PM John Stokes  MRN:  [id number]  Chief Complaint:  Chief Complaint    Follow-up     HPI:  John Stokes is a 28 year old Caucasian male, employed, lives in [place], married, has a history of bipolar disorder type II, insomnia was evaluated by telemedicine today.  Patient today reports he is currently working at a new job.  He reports he does not think he likes the job as much since there is not a lot of interaction with other people.  He is delivering medications to the pharmacy.  He reports he has noticed himself to be more depressed the past few weeks.  He had passive suicidal thoughts few days ago however he did not have a plan and currently denies any active thoughts or plan.  He was able to talk to his wife and that helped.  He reports he has started talking to her therapist who is actually a friend.  He reports so far it is going well and he does not want to restart psychotherapy sessions with our therapist at this time.  He however reports he would like his medications to be readjusted.  He is on a low dosage of Wellbutrin which helped him.  He is interested in dosage  increase.  Patient reports he sleeps through the night however feels unrested when he wakes up in the morning.  He reports he struggles with fatigue throughout the day.  He does not know if he snores or has apneic episodes.  He however doses of easily in situations like watching TV, as a passenger in a car, sitting quietly after lunch, sitting and reading and so on.  Discussed with patient that he can be referred for a sleep study.  Patient denies any other concerns today.      Visit Diagnosis:    ICD-10-CM   1. Bipolar 2 disorder (HCC)  F31.81 buPROPion (WELLBUTRIN XL) 300 MG 24 hr tablet   mild mixed  2. Insomnia due to mental disorder  F51.05 ARIPiprazole (ABILIFY) 15 MG tablet    Past Psychiatric History: I have reviewed past psychiatric history from my progress note on 04/01/2018.  Abilify, Wellbutrin-past trials.  Past Medical History:  Past Medical History:  Diagnosis Date  . ADHD (attention deficit hyperactivity disorder)   . Bipolar disorder (HCC)    History reviewed. No pertinent surgical history.  Family Psychiatric History: I have reviewed family psychiatric history from my progress note on 04/01/2018.  Family History:  Family History  Problem Relation Age of Onset  . Anxiety disorder Father   . Alcohol abuse Father   . Depression Mother   . Anxiety disorder Sister     Social History:  Reviewed social history from my progress note on 04/01/2018. Social History   Socioeconomic History  . Marital status: Married    Spouse name: makayla  . Number of children: 1  . Years of education: Not on file  . Highest education level: High school graduate  Occupational History  . Not on file  Social Needs  . Financial resource strain: Not hard at all  . Food insecurity    Worry: Never true    Inability: Never true  . Transportation needs    Medical: No    Non-medical: No  Tobacco Use  . Smoking status: Former Smoker    Types: Cigarettes    Quit date: 04/01/2013     Years since quitting: 5.9  . Smokeless tobacco: Never Used  Substance and Sexual Activity  . Alcohol use: No    Alcohol/week: 0.0 standard drinks  . Drug use: Never  . Sexual activity: Yes  Lifestyle  . Physical activity    Days per week: 0 days    Minutes per session: 0 min  . Stress: Rather much  Relationships  . Social Herbalist on phone: Not on file    Gets together: Not on file    Attends religious service: More than 4 times per year    Active member of club or organization: No    Attends meetings of clubs or organizations: Never    Relationship status: Married  Other Topics Concern  . Not on file  Social History Narrative  . Not on file    Allergies: No Known Allergies  Metabolic Disorder Labs: No results found for: HGBA1C, MPG No results found for: PROLACTIN No results found for: CHOL, TRIG, HDL, CHOLHDL, VLDL, LDLCALC No results found for: TSH  Therapeutic Level Labs: No results found for: LITHIUM No results found for: VALPROATE No components found for:  CBMZ  Current Medications: Current Outpatient Medications  Medication Sig Dispense Refill  . ARIPiprazole (ABILIFY) 15 MG tablet Take 1 tablet (15 mg total) by mouth daily. 30 tablet 2  . buPROPion (WELLBUTRIN XL) 300 MG 24 hr tablet Take 1 tablet (300 mg total) by mouth daily. 30 tablet 1   No current facility-administered medications for this visit.      Musculoskeletal: Strength & Muscle Tone: UTA Gait & Station: normal Patient leans: N/A  Psychiatric Specialty Exam: Review of Systems  Constitutional: Positive for malaise/fatigue.  Psychiatric/Behavioral: Positive for depression. The patient has insomnia.   All other systems reviewed and are negative.   There were no vitals taken for this visit.There is no height or weight on file to calculate BMI.  General Appearance: Casual  Eye Contact:  Fair  Speech:  Clear and Coherent  Volume:  Normal  Mood:  Depressed and Dysphoric   Affect:  Congruent  Thought Process:  Goal Directed and Descriptions of Associations: Intact  Orientation:  Full (Time, Place, and Person)  Thought Content: Logical   Suicidal Thoughts:  No  Homicidal Thoughts:  No  Memory:  Immediate;   Fair Recent;   Fair Remote;   Fair  Judgement:  Fair  Insight:  Fair  Psychomotor Activity:  Normal  Concentration:  Concentration: Fair and Attention Span: Fair  Recall:  [employer] of Knowledge: Fair  Language: Fair  Akathisia:  No  Handed:  Right  AIMS (if indicated): denies tremors, rigidity  Assets:  Communication Skills Desire for Improvement Housing Social Support Talents/Skills  ADL's:  Intact  Cognition: WNL  Sleep:  Restless   Screenings: AIMS     [facility]/15/[facility]  AIMS Total Score  0       Assessment and Plan: John Stokes is a 28 year old Caucasian male, lives in [place], has a history of bipolar disorder type II was evaluated by telemedicine today.  Patient is biologically predisposed given his history of mental health problems, he also has psychosocial stressors of work as well as having young child at home.  Patient continues to struggle with depressive symptoms as well as sleep problems and will benefit from medication readjustment.  Plan Bipolar disorder type II-some progress Abilify 15 mg p.o. daily, advised patient to try taking Abilify later on during the day to see if that will help with his fatigue. Increase Wellbutrin XL to 300 mg p.o. daily  Insomnia-unstable Patient reports he feels tired throughout the day and even after a good night sleep he feels unrested. Patient advised to continue sleep hygiene techniques. Epworth sleep scale was completed-he scored 10 on the same. Will refer for sleep study.  Labs-TSH, lipid panel, hemoglobin A1c, prolactin  EKG-reviewed EKG done on ----01/20/2019-EKG showed left atrial enlargement and right bundle branch block.   However his primary care provider did consult with cardiology per patient report and he is currently okay.   Follow-up in clinic in 4 to 6 weeks or sooner if needed.  December 14 at 9:40 AM  I have spent atleast 25 minutes non face to face with patient today. More than 50 % of the time was spent for psychoeducation and supportive psychotherapy and care coordination. This note was generated in part or whole with voice recognition software. Voice recognition is usually quite accurate but there are transcription errors that can and very often do occur. I apologize for any typographical errors that were not detected and corrected.     John LongsSaramma Jaquez Farrington, MD 03/24/2019, 5:22 PM

## 2019-03-24 ENCOUNTER — Ambulatory Visit (INDEPENDENT_AMBULATORY_CARE_PROVIDER_SITE_OTHER): Payer: 59 | Admitting: Psychiatry

## 2019-03-24 ENCOUNTER — Encounter: Payer: Self-pay | Admitting: Psychiatry

## 2019-03-24 ENCOUNTER — Other Ambulatory Visit: Payer: Self-pay

## 2019-03-24 DIAGNOSIS — F5105 Insomnia due to other mental disorder: Secondary | ICD-10-CM | POA: Diagnosis not present

## 2019-03-24 DIAGNOSIS — F3181 Bipolar II disorder: Secondary | ICD-10-CM

## 2019-03-24 MED ORDER — ARIPIPRAZOLE 15 MG PO TABS: 15.0000 mg | ORAL_TABLET | Freq: Every day | ORAL | 2 refills | Status: DC

## 2019-03-24 MED ORDER — BUPROPION HCL ER (XL) 300 MG PO TB24: 300.0000 mg | ORAL_TABLET | Freq: Every day | ORAL | 1 refills | Status: DC

## 2019-04-07 ENCOUNTER — Encounter: Payer: Self-pay | Admitting: Psychiatry

## 2019-04-07 ENCOUNTER — Ambulatory Visit (INDEPENDENT_AMBULATORY_CARE_PROVIDER_SITE_OTHER): Payer: 59 | Admitting: Psychiatry

## 2019-04-07 ENCOUNTER — Other Ambulatory Visit: Payer: Self-pay

## 2019-04-07 DIAGNOSIS — F5105 Insomnia due to other mental disorder: Secondary | ICD-10-CM | POA: Diagnosis not present

## 2019-04-07 DIAGNOSIS — F3181 Bipolar II disorder: Secondary | ICD-10-CM | POA: Diagnosis not present

## 2019-04-07 MED ORDER — RISPERIDONE 0.5 MG PO TABS: 0.5000 mg | ORAL_TABLET | Freq: Every day | ORAL | 0 refills | Status: DC

## 2019-04-07 NOTE — Progress Notes (Signed)
Virtual Visit via Video Note  I connected with John Stokes on 04/07/19 at  9:40 AM EST by a video enabled telemedicine application and verified that I am speaking with the correct person using two identifiers.   I discussed the limitations of evaluation and management by telemedicine and the availability of in person appointments. The patient expressed understanding and agreed to proceed.     I discussed the assessment and treatment plan with the patient. The patient was provided an opportunity to ask questions and all were answered. The patient agreed with the plan and demonstrated an understanding of the instructions.   The patient was advised to call back or seek an in-person evaluation if the symptoms worsen or if the condition fails to improve as anticipated.   BH MD OP Progress Note  04/07/2019 12:01 PM John Stokes  MRN:  [id number]  Chief Complaint:  Chief Complaint    Follow-up     HPI: John Stokes is a 28 year old Caucasian male, employed, lives in [place], married, has a history of bipolar type II, insomnia was evaluated by telemedicine today.  Patient reports he continues to struggle with fatigue and sadness.  He reports his body may be getting used to the Abilify and the Wellbutrin since he has not noticed much benefit from readjusting the dosages of these medications.  He is interested in a medication change today.  Discussed changing his Abilify to risperidone.  He agrees with plan.  Patient reports sleep is okay.  Patient reports he continues to work and work seems to be going well.  Patient denies any suicidality, homicidality or perceptual disturbances.  Patient denies any other concerns today. Visit Diagnosis:    ICD-10-CM   1. Bipolar 2 disorder (HCC)  F31.81 risperiDONE (RISPERDAL) 0.5 MG tablet   mixed , mild  2. Insomnia due to mental disorder  F51.05     Past Psychiatric History: Reviewed past psychiatric history from my progress note on 04/01/2018.   Abilify, Wellbutrin-past trials.  Past Medical History:  Past Medical History:  Diagnosis Date  . ADHD (attention deficit hyperactivity disorder)   . Bipolar disorder (HCC)    History reviewed. No pertinent surgical history.  Family Psychiatric History: Reviewed family psychiatric history from my progress note on 04/01/2018.  Family History:  Family History  Problem Relation Age of Onset  . Anxiety disorder Father   . Alcohol abuse Father   . Depression Mother   . Anxiety disorder Sister     Social History: Reviewed social history from my progress note on 04/01/2018. Social History   Socioeconomic History  . Marital status: Married    Spouse name: John Stokes  . Number of children: 1  . Years of education: Not on file  . Highest education level: High school graduate  Occupational History  . Not on file  Tobacco Use  . Smoking status: Former Smoker    Types: Cigarettes    Quit date: 04/01/2013    Years since quitting: 6.0  . Smokeless tobacco: Never Used  Substance and Sexual Activity  . Alcohol use: No    Alcohol/week: 0.0 standard drinks  . Drug use: Never  . Sexual activity: Yes  Other Topics Concern  . Not on file  Social History Narrative  . Not on file   Social Determinants of Health   Financial Resource Strain:   . Difficulty of Paying Living Expenses: Not on file  Food Insecurity:   . Worried About Programme researcher, broadcasting/film/videounning Out of Food in the Last  Year: Not on file  . Ran Out of Food in the Last Year: Not on file  Transportation Needs:   . Lack of Transportation (Medical): Not on file  . Lack of Transportation (Non-Medical): Not on file  Physical Activity:   . Days of Exercise per Week: Not on file  . Minutes of Exercise per Session: Not on file  Stress:   . Feeling of Stress : Not on file  Social Connections:   . Frequency of Communication with Friends and Family: Not on file  . Frequency of Social Gatherings with Friends and Family: Not on file  . Attends Religious  Services: Not on file  . Active Member of Clubs or Organizations: Not on file  . Attends Banker Meetings: Not on file  . Marital Status: Not on file    Allergies: No Known Allergies  Metabolic Disorder Labs: No results found for: HGBA1C, MPG No results found for: PROLACTIN No results found for: CHOL, TRIG, HDL, CHOLHDL, VLDL, LDLCALC No results found for: TSH  Therapeutic Level Labs: No results found for: LITHIUM No results found for: VALPROATE No components found for:  CBMZ  Current Medications: Current Outpatient Medications  Medication Sig Dispense Refill  . buPROPion (WELLBUTRIN XL) 300 MG 24 hr tablet Take 1 tablet (300 mg total) by mouth daily. 30 tablet 1  . risperiDONE (RISPERDAL) 0.5 MG tablet Take 1 tablet (0.5 mg total) by mouth at bedtime. 30 tablet 0   No current facility-administered medications for this visit.     Musculoskeletal: Strength & Muscle Tone: UTA Gait & Station: normal Patient leans: N/A  Psychiatric Specialty Exam: Review of Systems  Constitutional: Positive for fatigue.  Psychiatric/Behavioral: Positive for dysphoric mood.  All other systems reviewed and are negative.   There were no vitals taken for this visit.There is no height or weight on file to calculate BMI.  General Appearance: Casual  Eye Contact:  Fair  Speech:  Clear and Coherent  Volume:  Normal  Mood:  Depressed  Affect:  Appropriate  Thought Process:  Goal Directed and Descriptions of Associations: Intact  Orientation:  Full (Time, Place, and Person)  Thought Content: Logical   Suicidal Thoughts:  No  Homicidal Thoughts:  No  Memory:  Immediate;   Fair Recent;   Fair Remote;   Fair  Judgement:  Fair  Insight:  Fair  Psychomotor Activity:  Normal  Concentration:  Concentration: Fair and Attention Span: Fair  Recall:  [employer] of Knowledge: Fair  Language: Fair  Akathisia:  No  Handed:  Right  AIMS (if indicated):Denies tremors, rigidity  Assets:   Communication Skills Desire for Improvement Social Support  ADL's:  Intact  Cognition: WNL  Sleep:  feels unrested    Screenings: AIMS     [facility]  AIMS Total Score  0       Assessment and Plan: John Stokes is a 28 year old Caucasian male, lives in [place], has a history of bipolar disorder type II was evaluated by telemedicine today.  Patient is biologically predisposed given his history of mental health problems.  He also has psychosocial stressors of work as well as having young child at home, and the current pandemic.  Patient reports he continues to struggle with fatigue and sadness and is interested in medication readjustment.  Plan as noted below.  Plan Bipolar type II-unstable Discontinue Abilify for lack of benefit Start risperidone 0.5 mg p.o. nightly Wellbutrin XL 300 mg  p.o. daily  Insomnia-some progress Patient reports he continues to feel fatigued during the day.  Patient also reports feeling unrested once he wakes up in the morning. Patient has been referred for sleep study.  Pending labs-TSH, lipid panel, hemoglobin A1c, prolactin.  EKG-reviewed 01/20/2019.  Follow-up in clinic in 3 to 4 weeks or sooner if needed.  I have spent atleast 15 minutes non face to face with patient today. More than 50 % of the time was spent for psychoeducation and supportive psychotherapy and care coordination. This note was generated in part or whole with voice recognition software. Voice recognition is usually quite accurate but there are transcription errors that can and very often do occur. I apologize for any typographical errors that were not detected and corrected.       Ursula Alert, MD 04/07/2019, 12:01 PM

## 2019-04-10 ENCOUNTER — Telehealth: Payer: Self-pay | Admitting: Psychiatry

## 2019-04-10 NOTE — Telephone Encounter (Signed)
I have reviewed the following labs dated 01/21/2019- Lipid panel-within normal limits except for triglycerides elevated at 154.  Hemoglobin A1c-4.9 within normal limits  TSH-0.907-within normal limits  Prolactin low at 0.6.

## 2019-04-28 ENCOUNTER — Other Ambulatory Visit: Payer: Self-pay

## 2019-04-28 ENCOUNTER — Encounter: Payer: Self-pay | Admitting: Psychiatry

## 2019-04-28 ENCOUNTER — Ambulatory Visit (INDEPENDENT_AMBULATORY_CARE_PROVIDER_SITE_OTHER): Payer: 59 | Admitting: Psychiatry

## 2019-04-28 DIAGNOSIS — F3181 Bipolar II disorder: Secondary | ICD-10-CM

## 2019-04-28 DIAGNOSIS — Z9114 Patient's other noncompliance with medication regimen: Secondary | ICD-10-CM | POA: Diagnosis not present

## 2019-04-28 DIAGNOSIS — F5105 Insomnia due to other mental disorder: Secondary | ICD-10-CM | POA: Diagnosis not present

## 2019-04-28 MED ORDER — BUPROPION HCL ER (XL) 300 MG PO TB24: 300.0000 mg | ORAL_TABLET | Freq: Every day | ORAL | 1 refills | Status: DC

## 2019-04-28 NOTE — Progress Notes (Addendum)
Virtual Visit via Video Note  I connected with John Stokes on 04/28/19 at  4:00 PM EST by a video enabled telemedicine application and verified that I am speaking with the correct person using two identifiers.   I discussed the limitations of evaluation and management by telemedicine and the availability of in person appointments. The patient expressed understanding and agreed to proceed.   I discussed the assessment and treatment plan with the patient. The patient was provided an opportunity to ask questions and all were answered. The patient agreed with the plan and demonstrated an understanding of the instructions.   The patient was advised to call back or seek an in-person evaluation if the symptoms worsen or if the condition fails to improve as anticipated.   [place] MD OP Progress Note  04/28/2019 5:18 PM John Stokes  MRN:  [id number]  Chief Complaint:  Chief Complaint    Follow-up     HPI: John Stokes is a 29 year old Caucasian male, employed, lives in [place], married, has a history of bipolar disorder type II, insomnia was evaluated by telemedicine today.  Patient today reports he is currently in quarantine since he got exposed to COVID-19.  Patient however reports he and his wife both tested negative and currently does not have any symptoms.  He reports his mood symptoms are currently stable.  He reports he tried the risperidone once however felt groggy the next day and decided not to take it since he was worried about driving while on it.  Patient reports he hence decided to just stay on the Wellbutrin.  Patient reports mood wise he is doing okay just on the Wellbutrin.  Patient reports sleep is good.  Patient denies any suicidality, homicidality or perceptual disturbances.  Patient reports he is not interested in making any medications changes today and reports he is doing well. Visit Diagnosis:    ICD-10-CM   1. Bipolar 2 disorder (HCC)  F31.81 buPROPion (WELLBUTRIN XL) 300  MG 24 hr tablet   mild mixed- stable  2. Insomnia due to mental disorder  F51.05   3. Noncompliance with medication regimen  Z91.14     Past Psychiatric History: I have reviewed past psychiatric history from my progress note on 04/01/2018.  Past trials of Abilify, Wellbutrin, risperidone-side effects-noncompliant, Lamictal  Past Medical History:  Past Medical History:  Diagnosis Date  . ADHD (attention deficit hyperactivity disorder)   . Bipolar disorder ([place])    History reviewed. No pertinent surgical history.  Family Psychiatric History: I have reviewed family psychiatric history from my progress note on 04/01/2018.  Family History:  Family History  Problem Relation Age of Onset  . Anxiety disorder Father   . Alcohol abuse Father   . Depression Mother   . Anxiety disorder Sister     Social History: Reviewed social history from my progress note on 04/01/2018. Social History   Socioeconomic History  . Marital status: Married    Spouse name: John Stokes  . Number of children: 1  . Years of education: Not on file  . Highest education level: High school graduate  Occupational History  . Not on file  Tobacco Use  . Smoking status: Former Smoker    Types: Cigarettes    Quit date: 04/01/2013    Years since quitting: 6.0  . Smokeless tobacco: Never Used  Substance and Sexual Activity  . Alcohol use: No    Alcohol/week: 0.0 standard drinks  . Drug use: Never  . Sexual activity: Yes  Other  Topics Concern  . Not on file  Social History Narrative  . Not on file   Social Determinants of Health   Financial Resource Strain:   . Difficulty of Paying Living Expenses: Not on file  Food Insecurity:   . Worried About Programme researcher, broadcasting/film/video in the Last Year: Not on file  . Ran Out of Food in the Last Year: Not on file  Transportation Needs:   . Lack of Transportation (Medical): Not on file  . Lack of Transportation (Non-Medical): Not on file  Physical Activity:   . Days of Exercise  per Week: Not on file  . Minutes of Exercise per Session: Not on file  Stress:   . Feeling of Stress : Not on file  Social Connections:   . Frequency of Communication with Friends and Family: Not on file  . Frequency of Social Gatherings with Friends and Family: Not on file  . Attends Religious Services: Not on file  . Active Member of Clubs or Organizations: Not on file  . Attends Banker Meetings: Not on file  . Marital Status: Not on file    Allergies: No Known Allergies  Metabolic Disorder Labs: No results found for: HGBA1C, MPG No results found for: PROLACTIN No results found for: CHOL, TRIG, HDL, CHOLHDL, VLDL, LDLCALC No results found for: TSH  Therapeutic Level Labs: No results found for: LITHIUM No results found for: VALPROATE No components found for:  CBMZ  Current Medications: Current Outpatient Medications  Medication Sig Dispense Refill  . buPROPion (WELLBUTRIN XL) 300 MG 24 hr tablet Take 1 tablet (300 mg total) by mouth daily. 30 tablet 1   No current facility-administered medications for this visit.     Musculoskeletal: Strength & Muscle Tone: UTA Gait & Station: normal Patient leans: N/A  Psychiatric Specialty Exam: Review of Systems  Psychiatric/Behavioral: Negative for agitation, behavioral problems, confusion, decreased concentration, dysphoric mood, hallucinations, self-injury, sleep disturbance and suicidal ideas. The patient is not nervous/anxious and is not hyperactive.   All other systems reviewed and are negative.   There were no vitals taken for this visit.There is no height or weight on file to calculate BMI.  General Appearance: Casual  Eye Contact:  Fair  Speech:  Clear and Coherent  Volume:  Normal  Mood:  Euthymic  Affect:  Congruent  Thought Process:  Goal Directed and Descriptions of Associations: Intact  Orientation:  Full (Time, Place, and Person)  Thought Content: Logical   Suicidal Thoughts:  No  Homicidal  Thoughts:  No  Memory:  Immediate;   Fair Recent;   Fair Remote;   Fair  Judgement:  Fair  Insight:  Fair  Psychomotor Activity:  Normal  Concentration:  Concentration: Fair and Attention Span: Fair  Recall:  [employer] of Knowledge: Fair  Language: Fair  Akathisia:  No  Handed:  Right  AIMS (if indicated): Denies tremors, rigidity  Assets:  Communication Skills Desire for Improvement Housing Intimacy Social Support Talents/Skills  ADL's:  Intact  Cognition: WNL  Sleep:  Fair   Screenings: AIMS     [facility] Visit [facility]/15/2018 in [facility]  AIMS Total Score  0       Assessment and Plan: John Stokes is a 29 year old Caucasian male, lives in [place], has a history of bipolar disorder type II was evaluated by telemedicine today.  Patient is biologically predisposed given his history of mental health problems.  He also has psychosocial stressors of work as well  as having a child at home.  Patient also is currently in quarantine due to COVID-19 exposure however tested negative.  Patient currently reports mood symptoms are stable just on Wellbutrin and declines any medication changes.  Plan as noted below.  Plan Bipolar disorder type II-improving Discontinue risperidone for noncompliance and possible side effects. Wellbutrin XL 300 mg p.o. daily  Insomnia-improving Patient reports he is sleeping better now.  Patient has been referred for sleep study-pending  Noncompliance with medication regimen-patient stopped the risperidone and he did not reach out to writer to discuss other options.  Patient however today reports risperidone gave him side effects and his mood symptoms are stable.  We will monitor patient closely.  Follow-up in clinic in 6 to 8 weeks or sooner if needed.  Patient advised to make a sooner appointment if he has any worsening mood symptoms.  February 25 at 4:20 PM  I have spent atleast 20 minutes non face to face with patient  today. More than 50 % of the time was spent for psychoeducation and supportive psychotherapy and care coordination. This note was generated in part or whole with voice recognition software. Voice recognition is usually quite accurate but there are transcription errors that can and very often do occur. I apologize for any typographical errors that were not detected and corrected.       Jomarie Longs, MD 04/28/2019, 5:18 PM

## 2019-06-19 ENCOUNTER — Other Ambulatory Visit: Payer: Self-pay

## 2019-06-19 ENCOUNTER — Encounter: Payer: Self-pay | Admitting: Psychiatry

## 2019-06-19 ENCOUNTER — Ambulatory Visit (INDEPENDENT_AMBULATORY_CARE_PROVIDER_SITE_OTHER): Payer: 59 | Admitting: Psychiatry

## 2019-06-19 DIAGNOSIS — F5105 Insomnia due to other mental disorder: Secondary | ICD-10-CM

## 2019-06-19 DIAGNOSIS — F3177 Bipolar disorder, in partial remission, most recent episode mixed: Secondary | ICD-10-CM | POA: Diagnosis not present

## 2019-06-19 MED ORDER — BUPROPION HCL ER (XL) 300 MG PO TB24: 300.0000 mg | ORAL_TABLET | Freq: Every day | ORAL | 3 refills | Status: DC

## 2019-06-19 NOTE — Progress Notes (Signed)
Provider Location : [facility] Patient Location : Home  Virtual Visit via Video Note  I connected with John Stokes on 06/19/19 at  4:20 PM EST by a video enabled telemedicine application and verified that I am speaking with the correct person using two identifiers.   I discussed the limitations of evaluation and management by telemedicine and the availability of in person appointments. The patient expressed understanding and agreed to proceed.     I discussed the assessment and treatment plan with the patient. The patient was provided an opportunity to ask questions and all were answered. The patient agreed with the plan and demonstrated an understanding of the instructions.   The patient was advised to call back or seek an in-person evaluation if the symptoms worsen or if the condition fails to improve as anticipated.   [place] MD OP Progress Note  06/19/2019 4:53 PM RAHIL PASSEY  MRN:  [id number]  Chief Complaint:  Chief Complaint    Follow-up     HPI: John Stokes is a 29 year old Caucasian male, employed, lives in [place], married, has a history of bipolar disorder type II, insomnia was evaluated by telemedicine today.  Patient today reports he is currently doing well.  He denies any mood lability.  Patient reports sleep is good.  He was able to get a body pillow which has helped him to rest better.  Patient reports he is compliant on the Wellbutrin as prescribed.  He denies any side effects.  Patient denies any suicidality, homicidality or perceptual disturbances.   Patient denies any other concerns today.   Visit Diagnosis:    ICD-10-CM   1. Bipolar disorder, in partial remission, most recent episode mixed (HCC)  F31.77 buPROPion (WELLBUTRIN XL) 300 MG 24 hr tablet  2. Insomnia due to mental disorder  F51.05     Past Psychiatric History: I have reviewed past psychiatric history from my progress note on 04/01/2018.  Past trials of Abilify, Wellbutrin, risperidone-side  effects-noncompliant, Lamictal.  Past Medical History:  Past Medical History:  Diagnosis Date  . ADHD (attention deficit hyperactivity disorder)   . Bipolar disorder ([place])    History reviewed. No pertinent surgical history.  Family Psychiatric History: I have reviewed family psychiatric history from my progress note on 04/01/2018.  Family History:  Family History  Problem Relation Age of Onset  . Anxiety disorder Father   . Alcohol abuse Father   . Depression Mother   . Anxiety disorder Sister     Social History: Reviewed social history from my progress note on 04/01/2018. Social History   Socioeconomic History  . Marital status: Married    Spouse name: makayla  . Number of children: 1  . Years of education: Not on file  . Highest education level: High school graduate  Occupational History  . Not on file  Tobacco Use  . Smoking status: Former Smoker    Types: Cigarettes    Quit date: 04/01/2013    Years since quitting: 6.2  . Smokeless tobacco: Never Used  Substance and Sexual Activity  . Alcohol use: No    Alcohol/week: 0.0 standard drinks  . Drug use: Never  . Sexual activity: Yes  Other Topics Concern  . Not on file  Social History Narrative  . Not on file   Social Determinants of Health   Financial Resource Strain:   . Difficulty of Paying Living Expenses: Not on file  Food Insecurity:   . Worried About Charity fundraiser in the Last Year:  Not on file  . Ran Out of Food in the Last Year: Not on file  Transportation Needs:   . Lack of Transportation (Medical): Not on file  . Lack of Transportation (Non-Medical): Not on file  Physical Activity:   . Days of Exercise per Week: Not on file  . Minutes of Exercise per Session: Not on file  Stress:   . Feeling of Stress : Not on file  Social Connections:   . Frequency of Communication with Friends and Family: Not on file  . Frequency of Social Gatherings with Friends and Family: Not on file  . Attends  Religious Services: Not on file  . Active Member of Clubs or Organizations: Not on file  . Attends Banker Meetings: Not on file  . Marital Status: Not on file    Allergies: No Known Allergies  Metabolic Disorder Labs: No results found for: HGBA1C, MPG No results found for: PROLACTIN No results found for: CHOL, TRIG, HDL, CHOLHDL, VLDL, LDLCALC No results found for: TSH  Therapeutic Level Labs: No results found for: LITHIUM No results found for: VALPROATE No components found for:  CBMZ  Current Medications: Current Outpatient Medications  Medication Sig Dispense Refill  . buPROPion (WELLBUTRIN XL) 300 MG 24 hr tablet Take 1 tablet (300 mg total) by mouth daily. 30 tablet 3   No current facility-administered medications for this visit.     Musculoskeletal: Strength & Muscle Tone: UTA Gait & Station: normal Patient leans: N/A  Psychiatric Specialty Exam: Review of Systems  Psychiatric/Behavioral: Negative for agitation, behavioral problems, confusion, decreased concentration, dysphoric mood, hallucinations, self-injury, sleep disturbance and suicidal ideas. The patient is not nervous/anxious and is not hyperactive.   All other systems reviewed and are negative.   There were no vitals taken for this visit.There is no height or weight on file to calculate BMI.  General Appearance: Casual  Eye Contact:  Fair  Speech:  Clear and Coherent  Volume:  Normal  Mood:  Euthymic  Affect:  Congruent  Thought Process:  Goal Directed and Descriptions of Associations: Intact  Orientation:  Full (Time, Place, and Person)  Thought Content: Logical   Suicidal Thoughts:  No  Homicidal Thoughts:  No  Memory:  Immediate;   Fair Recent;   Fair Remote;   Fair  Judgement:  Fair  Insight:  Fair  Psychomotor Activity:  Normal  Concentration:  Concentration: Fair and Attention Span: Fair  Recall:  [employer] of Knowledge: Fair  Language: Fair  Akathisia:  No  Handed:   Right  AIMS (if indicated): UTA  Assets:  Communication Skills Desire for Improvement Social Support  ADL's:  Intact  Cognition: WNL  Sleep:  Fair   Screenings: AIMS     [facility]  AIMS Total Score  0       Assessment and Plan: John Stokes is a 29 year old Caucasian male, lives in [place], has a history of bipolar disorder type II was evaluated by telemedicine today.  Patient is biologically predisposed given his mental health problems.  He also has psychosocial stressors of work as well as having a child at home.  Patient is currently stable on Wellbutrin.  Plan as noted below.  Plan Bipolar disorder type II-in partial remission Wellbutrin XL 300 mg p.o. daily.  Insomnia-stable Patient was referred for sleep study-he has been noncompliant. Patient reports his sleep is better.  Will monitor closely.  If patient continues to do well he  can be transitioned back to his primary care provider.  Discussed this with patient.  Follow-up in clinic in 2 to 3 months or sooner if needed.  I have spent atleast 15 minutes non face to face with patient today. More than 50 % of the time was spent for ordering medications and test ,psychoeducation and supportive psychotherapy and care coordination,as well as documenting clinical information in electronic health record. This note was generated in part or whole with voice recognition software. Voice recognition is usually quite accurate but there are transcription errors that can and very often do occur. I apologize for any typographical errors that were not detected and corrected.       Jomarie Longs, MD 06/19/2019, 4:53 PM

## 2019-07-03 ENCOUNTER — Telehealth: Payer: Self-pay

## 2019-07-03 NOTE — Telephone Encounter (Signed)
Returned call to patient.  Discussed with patient that his current symptoms could be a side effect of the Wellbutrin.  He currently denies any depression.  He currently denies any suicidality.  Discussed with patient his Wellbutrin dosage can be reduced.  However he is not willing to do that at this time.  He wants to give it more time.  He is currently taking a boost shake to help with his nutritional needs.  He reports he will try to make an attempt to eat better.  He will let writer know if he needs any other help.

## 2019-07-03 NOTE — Telephone Encounter (Signed)
Patient called and stated that he is happy and feels stable on his medication but he and his wife have noticed some side effects. He stated that he's had a lack of interest, loss of appetite and lack of sexual drive. He stated that he would like your thoughts on this. Please review and advise. Thank you.

## 2019-09-05 ENCOUNTER — Telehealth: Payer: Self-pay

## 2019-09-05 ENCOUNTER — Ambulatory Visit (LOCAL_COMMUNITY_HEALTH_CENTER): Payer: Self-pay

## 2019-09-05 ENCOUNTER — Other Ambulatory Visit: Payer: Self-pay

## 2019-09-05 VITALS — Wt 145.0 lb

## 2019-09-05 DIAGNOSIS — R7612 Nonspecific reaction to cell mediated immunity measurement of gamma interferon antigen response without active tuberculosis: Secondary | ICD-10-CM

## 2019-09-05 NOTE — Telephone Encounter (Signed)
See EPI from today. Kerri Kovacik, RN  

## 2019-09-06 LAB — CBC WITH DIFFERENTIAL/PLATELET
Basophils Absolute: 0.1 10*3/uL (ref 0.0–0.2)
Basos: 1 %
EOS (ABSOLUTE): 0.1 10*3/uL (ref 0.0–0.4)
Eos: 2 %
Hematocrit: 45.3 % (ref 37.5–51.0)
Hemoglobin: 15.4 g/dL (ref 13.0–17.7)
Immature Grans (Abs): 0 10*3/uL (ref 0.0–0.1)
Immature Granulocytes: 0 %
Lymphocytes Absolute: 2.2 10*3/uL (ref 0.7–3.1)
Lymphs: 36 %
MCH: 30.1 pg (ref 26.6–33.0)
MCHC: 34 g/dL (ref 31.5–35.7)
MCV: 89 fL (ref 79–97)
Monocytes Absolute: 0.5 10*3/uL (ref 0.1–0.9)
Monocytes: 8 %
Neutrophils Absolute: 3.2 10*3/uL (ref 1.4–7.0)
Neutrophils: 53 %
Platelets: 254 10*3/uL (ref 150–450)
RBC: 5.12 x10E6/uL (ref 4.14–5.80)
RDW: 11.9 % (ref 11.6–15.4)
WBC: 6.1 10*3/uL (ref 3.4–10.8)

## 2019-09-06 LAB — HEPATIC FUNCTION PANEL
ALT: 16 IU/L (ref 0–44)
AST: 20 IU/L (ref 0–40)
Albumin: 5 g/dL (ref 4.1–5.2)
Alkaline Phosphatase: 46 IU/L (ref 39–117)
Bilirubin Total: 0.5 mg/dL (ref 0.0–1.2)
Bilirubin, Direct: 0.16 mg/dL (ref 0.00–0.40)
Total Protein: 6.8 g/dL (ref 6.0–8.5)

## 2019-09-08 NOTE — Progress Notes (Signed)
Referred from [facility] health senior care after an employment screening.  +QFT and normal CXR on 09/01/19 with [facility].  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 Baseline labs drawn today. Richmond Campbell, RN

## 2019-09-10 ENCOUNTER — Telehealth (INDEPENDENT_AMBULATORY_CARE_PROVIDER_SITE_OTHER): Payer: 59 | Admitting: Psychiatry

## 2019-09-10 ENCOUNTER — Ambulatory Visit (LOCAL_COMMUNITY_HEALTH_CENTER): Payer: Self-pay

## 2019-09-10 ENCOUNTER — Other Ambulatory Visit: Payer: Self-pay | Admitting: Family Medicine

## 2019-09-10 ENCOUNTER — Other Ambulatory Visit: Payer: Self-pay

## 2019-09-10 ENCOUNTER — Encounter: Payer: Self-pay | Admitting: Psychiatry

## 2019-09-10 VITALS — Wt 145.0 lb

## 2019-09-10 DIAGNOSIS — R7612 Nonspecific reaction to cell mediated immunity measurement of gamma interferon antigen response without active tuberculosis: Secondary | ICD-10-CM

## 2019-09-10 DIAGNOSIS — F3178 Bipolar disorder, in full remission, most recent episode mixed: Secondary | ICD-10-CM | POA: Diagnosis not present

## 2019-09-10 DIAGNOSIS — Z9114 Patient's other noncompliance with medication regimen: Secondary | ICD-10-CM

## 2019-09-10 DIAGNOSIS — F5105 Insomnia due to other mental disorder: Secondary | ICD-10-CM | POA: Diagnosis not present

## 2019-09-10 MED ORDER — RIFAMPIN 300 MG PO CAPS: 600.0000 mg | ORAL_CAPSULE | Freq: Every day | ORAL | 3 refills | Status: AC

## 2019-09-10 MED ORDER — RIFAMPIN 300 MG PO CAPS: 600.0000 mg | ORAL_CAPSULE | Freq: Every day | ORAL | 0 refills | Status: AC

## 2019-09-10 NOTE — Progress Notes (Signed)
Rifampin 300mg  #60 dispensed per [facility] 09/10/19 order. No labs drawn today d/t patient recently had baseline labs. TB RN will call to schedule next [facility] appt. 09/12/19, RN

## 2019-09-10 NOTE — Progress Notes (Signed)
Provider Location : [facility] Patient Location : Work  Virtual Visit via Video Note  I connected with John Stokes on 09/10/19 at  4:20 PM EDT by a video enabled telemedicine application and verified that I am speaking with the correct person using two identifiers.   I discussed the limitations of evaluation and management by telemedicine and the availability of in person appointments. The patient expressed understanding and agreed to proceed.   I discussed the assessment and treatment plan with the patient. The patient was provided an opportunity to ask questions and all were answered. The patient agreed with the plan and demonstrated an understanding of the instructions.   The patient was advised to call back or seek an in-person evaluation if the symptoms worsen or if the condition fails to improve as anticipated.  John Mathews MD OP Progress Note  09/10/2019 5:30 PM John Stokes  MRN:  [id number]  Chief Complaint:  Chief Complaint    Follow-up     HPI: John Stokes is a 29 year old Caucasian male, employed, lives in [place], married, has a history of bipolar disorder type II, insomnia was evaluated by telemedicine today.  Patient today reports he is currently doing well with regards to his mood.  He denies any significant depression or anxiety symptoms.  He denies any irritability.  He reports sleep continues to be good.  He reports his appetite has improved.  He is currently eating smaller portions which helps.  He reports work is going well.  Patient reports he recently had an abnormal reaction to the tuberculosis test and is currently on rifampin for the next 3 to 4 months.  Patient is compliant on Wellbutrin.  Patient denies any suicidality, homicidality or perceptual disturbances.  Patient denies any other concerns today.   Visit Diagnosis:    ICD-10-CM   1. Bipolar disorder, in full remission, most recent episode mixed (John Stokes)  F31.78   2. Insomnia due to mental disorder   F51.05   3. Noncompliance with medication regimen  Z91.14     Past Psychiatric History: I have reviewed past psychiatric history from my progress note on 04/01/2018.  Past trials of Abilify, Wellbutrin, risperidone side effects  Past Medical History:  Past Medical History:  Diagnosis Date  . ADHD (attention deficit hyperactivity disorder)   . Bipolar disorder ([place])    History reviewed. No pertinent surgical history.  Family Psychiatric History: I have reviewed family psychiatric history from my progress note on 04/01/2018  Family History:  Family History  Problem Relation Age of Onset  . Anxiety disorder Father   . Alcohol abuse Father   . Depression Mother   . Anxiety disorder Sister     Social History: I have reviewed social history from my progress note on 04/01/2018 Social History   Socioeconomic History  . Marital status: Married    Spouse name: makayla  . Number of children: 1  . Years of education: Not on file  . Highest education level: High school graduate  Occupational History  . Not on file  Tobacco Use  . Smoking status: Former Smoker    Types: Cigarettes    Quit date: 04/01/2013    Years since quitting: 6.4  . Smokeless tobacco: Never Used  Substance and Sexual Activity  . Alcohol use: Yes    Alcohol/week: 0.0 standard drinks    Comment: rare   . Drug use: Not Currently  . Sexual activity: Yes    Partners: Female  Other Topics Concern  .  Not on file  Social History Narrative  . Not on file   Social Determinants of Health   Financial Resource Strain:   . Difficulty of Paying Living Expenses:   Food Insecurity:   . Worried About Programme researcher, broadcasting/film/video in the Last Year:   . Barista in the Last Year:   Transportation Needs:   . Freight forwarder (Medical):   Marland Kitchen Lack of Transportation (Non-Medical):   Physical Activity:   . Days of Exercise per Week:   . Minutes of Exercise per Session:   Stress:   . Feeling of Stress :   Social  Connections:   . Frequency of Communication with Friends and Family:   . Frequency of Social Gatherings with Friends and Family:   . Attends Religious Services:   . Active Member of Clubs or Organizations:   . Attends Banker Meetings:   Marland Kitchen Marital Status:     Allergies: No Known Allergies  Metabolic Disorder Labs: No results found for: HGBA1C, MPG No results found for: PROLACTIN No results found for: CHOL, TRIG, HDL, CHOLHDL, VLDL, LDLCALC No results found for: TSH  Therapeutic Level Labs: No results found for: LITHIUM No results found for: VALPROATE No components found for:  CBMZ  Current Medications: Current Outpatient Medications  Medication Sig Dispense Refill  . buPROPion (WELLBUTRIN XL) 300 MG 24 hr tablet Take 1 tablet (300 mg total) by mouth daily. 30 tablet 3  . rifampin (RIFADIN) 300 MG capsule Take 2 capsules (600 mg total) by mouth daily. 60 capsule 3  . rifampin (RIFADIN) 300 MG capsule Take 2 capsules (600 mg total) by mouth daily. 60 capsule 0   No current facility-administered medications for this visit.     Musculoskeletal: Strength & Muscle Tone: UTA Gait & Station: normal Patient leans: N/A  Psychiatric Specialty Exam: Review of Systems  Psychiatric/Behavioral: Negative for agitation, behavioral problems, confusion, decreased concentration, dysphoric mood, hallucinations, self-injury, sleep disturbance and suicidal ideas. The patient is not nervous/anxious and is not hyperactive.   All other systems reviewed and are negative.   There were no vitals taken for this visit.There is no height or weight on file to calculate BMI.  General Appearance: Casual  Eye Contact:  Fair  Speech:  Clear and Coherent  Volume:  Normal  Mood:  Euthymic  Affect:  Congruent  Thought Process:  Goal Directed and Descriptions of Associations: Intact  Orientation:  Full (Time, Place, and Person)  Thought Content: Logical   Suicidal Thoughts:  No  Homicidal  Thoughts:  No  Memory:  Immediate;   Fair Recent;   Fair Remote;   Fair  Judgement:  Fair  Insight:  Fair  Psychomotor Activity:  Normal  Concentration:  Concentration: Fair and Attention Span: Fair  Recall:  [employer] of Knowledge: Fair  Language: Fair  Akathisia:  No  Handed:  Right  AIMS (if indicated): UTA  Assets:  Communication Skills Desire for Improvement Housing Social Support  ADL's:  Intact  Cognition: WNL  Sleep:  Fair   Screenings: AIMS     [facility]/15/2018 in [facility]  AIMS Total Score  0       Assessment and Plan: John Stokes is a 29 year old Caucasian male, lives in [place], has a history of bipolar disorder type II was evaluated by telemedicine today.  Patient is biologically predisposed given his history of mental health problems in his family.  Patient with  psychosocial stressors of work as well as the pandemic and current health issues.  Patient however is currently stable on Wellbutrin.  Plan as noted below.  Plan Bipolar disorder type II in remission Wellbutrin XL 300 mg p.o. daily  Insomnia-stable Patient was referred for sleep study in the past has been noncompliant He currently reports sleep is good.  I have discussed with patient that drug to drug interaction between Wellbutrin and rifampin, will monitor therapy. I have reviewed medical records in [facility] dated 09/10/2019-per Richmond Campbell RN -patient with nonspecific reaction to cell-mediated immunity measurement of, interferon antigen response without active tuberculosis-rifampin 300 mg started.'  Patient reports he wants to follow-up with his primary care provider however would like to return to [facility] for once a year appointment.  Follow-up in clinic as needed.  I have spent atleast 20 minutes non face to face with patient today. More than 50 % of the time was spent for preparing to see the patient ( e.g., review of test, records ),  obtaining and to review and separately obtained history , ordering medications , psychoeducation and supportive psychotherapy and care coordination,as well as documenting clinical information in electronic health record. This note was generated in part or whole with voice recognition software. Voice recognition is usually quite accurate but there are transcription errors that can and very often do occur. I apologize for any typographical errors that were not detected and corrected.        Jomarie Longs, MD 09/10/2019, 5:30 PM

## 2019-09-10 NOTE — Progress Notes (Signed)
Tuberculosis treatment orders  All patients are to be monitored per [place] and county TB policies.   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 Treat for latent TB per the following:  Rifampin 600mg  daily by mouth x 4 months, draw LFTs monthly per Dr. .  +QFT; normal CXR- reports sent for scanning.

## 2019-09-30 ENCOUNTER — Telehealth: Payer: Self-pay

## 2019-09-30 DIAGNOSIS — Z79899 Other long term (current) drug therapy: Secondary | ICD-10-CM

## 2019-09-30 DIAGNOSIS — Z9189 Other specified personal risk factors, not elsewhere classified: Secondary | ICD-10-CM

## 2019-09-30 DIAGNOSIS — F5105 Insomnia due to other mental disorder: Secondary | ICD-10-CM

## 2019-09-30 DIAGNOSIS — F3161 Bipolar disorder, current episode mixed, mild: Secondary | ICD-10-CM

## 2019-09-30 MED ORDER — ARIPIPRAZOLE 5 MG PO TABS: 5.0000 mg | ORAL_TABLET | Freq: Every day | ORAL | 0 refills | Status: DC

## 2019-09-30 NOTE — Telephone Encounter (Signed)
Returned call to patient.  Patient does report irritability.  He reports he wants to go back on the Abilify if possible.  Discussed with patient to get an EKG to monitor QTC.  We will continue Wellbutrin at the same dosage for now.  Discussed with him the interaction between rifampin and Wellbutrin.  We will start low dosage Abilify.  He will monitor himself closely.  Discussed with him the interaction between Abilify and rifampin.

## 2019-09-30 NOTE — Telephone Encounter (Signed)
pt called wanted to know if he can go back on the abilify and stop the bupropion.  states that he got TB and that he on antibodic and he is having moods swings.

## 2019-09-30 NOTE — Telephone Encounter (Signed)
please call they wanted to speak with you about him going back on abilify.

## 2019-10-02 ENCOUNTER — Telehealth: Payer: Self-pay | Admitting: Family Medicine

## 2019-10-02 NOTE — Telephone Encounter (Signed)
Pt has questions about TB medication.

## 2019-10-03 ENCOUNTER — Ambulatory Visit (LOCAL_COMMUNITY_HEALTH_CENTER): Payer: Self-pay

## 2019-10-03 ENCOUNTER — Other Ambulatory Visit: Payer: Self-pay

## 2019-10-03 VITALS — Wt 139.2 lb

## 2019-10-03 DIAGNOSIS — R7612 Nonspecific reaction to cell mediated immunity measurement of gamma interferon antigen response without active tuberculosis: Secondary | ICD-10-CM

## 2019-10-03 MED ORDER — RIFAMPIN 300 MG PO CAPS: 600.0000 mg | ORAL_CAPSULE | Freq: Every day | ORAL | 0 refills | Status: AC

## 2019-10-03 NOTE — Progress Notes (Signed)
Rifampin 300mg  #60 dispensed per [facility] order. [facility], RN     Patient reports psychiatrist has added Abilify 5mg .  States recently felt more depressed and anxious but admits there has been a lot going on at home.  "I wonder if everything hitting at one time caused me to feel this way". Discussed options re: LTBI tx snf how medications can be affected by Rifampin. Patient would like to continue with Rifampin and see how he feels with Ability and Rifampin.  Verbalized understanding to call TB RN with any problems or concerns.   TB RN discussed patient complaints and medication options 10/02/19 with Dr. .  TB RN will call patient in 2 weeks to f/u 12/02/19, RN

## 2019-10-04 LAB — HEPATIC FUNCTION PANEL
ALT: 16 IU/L (ref 0–44)
AST: 19 IU/L (ref 0–40)
Albumin: 4.8 g/dL (ref 4.1–5.2)
Alkaline Phosphatase: 55 IU/L (ref 48–121)
Bilirubin Total: 0.5 mg/dL (ref 0.0–1.2)
Bilirubin, Direct: 0.2 mg/dL (ref 0.00–0.40)
Total Protein: 6.7 g/dL (ref 6.0–8.5)

## 2019-10-09 ENCOUNTER — Encounter: Payer: Self-pay | Admitting: Psychiatry

## 2019-10-09 ENCOUNTER — Other Ambulatory Visit: Payer: Self-pay

## 2019-10-09 ENCOUNTER — Telehealth (INDEPENDENT_AMBULATORY_CARE_PROVIDER_SITE_OTHER): Payer: 59 | Admitting: Psychiatry

## 2019-10-09 DIAGNOSIS — F3161 Bipolar disorder, current episode mixed, mild: Secondary | ICD-10-CM | POA: Diagnosis not present

## 2019-10-09 DIAGNOSIS — F5105 Insomnia due to other mental disorder: Secondary | ICD-10-CM | POA: Diagnosis not present

## 2019-10-09 DIAGNOSIS — F410 Panic disorder [episodic paroxysmal anxiety] without agoraphobia: Secondary | ICD-10-CM

## 2019-10-09 MED ORDER — HYDROXYZINE HCL 10 MG PO TABS: 10.0000 mg | ORAL_TABLET | Freq: Three times a day (TID) | ORAL | 1 refills | Status: DC | PRN

## 2019-10-09 NOTE — Progress Notes (Signed)
Provider Location : [facility] Patient Location : Home  Virtual Visit via Video Note  I connected with John Stokes on 10/09/19 at 10:45 AM EDT by a video enabled telemedicine application and verified that I am speaking with the correct person using two identifiers.   I discussed the limitations of evaluation and management by telemedicine and the availability of in person appointments. The patient expressed understanding and agreed to proceed.   I discussed the assessment and treatment plan with the patient. The patient was provided an opportunity to ask questions and all were answered. The patient agreed with the plan and demonstrated an understanding of the instructions.   The patient was advised to call back or seek an in-person evaluation if the symptoms worsen or if the condition fails to improve as anticipated.  [facility] MD OP Progress Note  10/09/2019 9:00 PM John Stokes  MRN:  [id number]  Chief Complaint:  Chief Complaint    Follow-up     John Stokes is a 29 year old Caucasian male, employed, lives in [place], married, has a history of bipolar disorder type II, insomnia was evaluated by telemedicine today.  Patient was last evaluated on 09/10/2019.  Patient at that visit had reported he was doing well on the current medication regimen and he wants to follow-up with his primary care provider and would like to return to [facility] maybe once a year if possible.  Patient however today returns reporting that he had a panic attack recently.  Patient reports he does have situational stressors, job related problems which may have triggered this episode.  He described his panic symptoms as having nervousness, restlessness, being fidgety, shortness of breath and so on.  He however reports he was able to relax and deep breathe and this helped to resolve the panic symptoms.  Patient reports he also has some irritability on and off.  He reports the Wellbutrin and Abilify as helpful.  Patient  denies any suicidality, homicidality or perceptual disturbances.  Patient continues to take rifampin as prescribed by his provider.  Patient today declines any medication changes or psychotherapy referral at our office.  He reports he is currently trying to find a new therapist and a new provider who will be more flexible with his schedule.  He however reports he just wants a letter stating that he is ready to go back to work.    Visit Diagnosis:    ICD-10-CM   1. Bipolar disorder, current episode mixed, mild ([facility])  F31.61   2. Insomnia due to mental disorder  F51.05   3. Anxiety attack  F41.0 hydrOXYzine (ATARAX/VISTARIL) 10 MG tablet    Past Psychiatric History: I have reviewed past psychiatric history from my progress note on 04/01/2018.  Past trials of Abilify, Wellbutrin, risperidone-side effect.  Past Medical History:  Past Medical History:  Diagnosis Date  . ADHD (attention deficit hyperactivity disorder)   . Bipolar disorder ([facility])    History reviewed. No pertinent surgical history.  Family Psychiatric History: I have reviewed family psychiatric history from my progress note on 04/01/2018  Family History:  Family History  Problem Relation Age of Onset  . Anxiety disorder Father   . Alcohol abuse Father   . Depression Mother   . Anxiety disorder Sister     Social History: Reviewed social history from my progress note on 04/01/2018 Social History   Socioeconomic History  . Marital status: Married    Spouse name: makayla  . Number of children: 1  . Years  of education: Not on file  . Highest education level: High school graduate  Occupational History  . Not on file  Tobacco Use  . Smoking status: Former Smoker    Types: Cigarettes    Quit date: 04/01/2013    Years since quitting: 6.5  . Smokeless tobacco: Never Used  Vaping Use  . Vaping Use: Never used  Substance and Sexual Activity  . Alcohol use: Yes    Alcohol/week: 0.0 standard drinks    Comment: rare   .  Drug use: Not Currently  . Sexual activity: Yes    Partners: Female  Other Topics Concern  . Not on file  Social History Narrative  . Not on file   Social Determinants of Health   Financial Resource Strain:   . Difficulty of Paying Living Expenses:   Food Insecurity:   . Worried About Charity fundraiser in the Last Year:   . Arboriculturist in the Last Year:   Transportation Needs:   . Film/video editor (Medical):   Marland Kitchen Lack of Transportation (Non-Medical):   Physical Activity:   . Days of Exercise per Week:   . Minutes of Exercise per Session:   Stress:   . Feeling of Stress :   Social Connections:   . Frequency of Communication with Friends and Family:   . Frequency of Social Gatherings with Friends and Family:   . Attends Religious Services:   . Active Member of Clubs or Organizations:   . Attends Archivist Meetings:   Marland Kitchen Marital Status:     Allergies: No Known Allergies  Metabolic Disorder Labs: No results found for: HGBA1C, MPG No results found for: PROLACTIN No results found for: CHOL, TRIG, HDL, CHOLHDL, VLDL, LDLCALC No results found for: TSH  Therapeutic Level Labs: No results found for: LITHIUM No results found for: VALPROATE No components found for:  CBMZ  Current Medications: Current Outpatient Medications  Medication Sig Dispense Refill  . ARIPiprazole (ABILIFY) 5 MG tablet Take 1 tablet (5 mg total) by mouth daily. 30 tablet 0  . buPROPion (WELLBUTRIN XL) 300 MG 24 hr tablet Take 1 tablet (300 mg total) by mouth daily. 30 tablet 3  . hydrOXYzine (ATARAX/VISTARIL) 10 MG tablet Take 1 tablet (10 mg total) by mouth 3 (three) times daily as needed for anxiety. For severe anxiety attacks only 45 tablet 1  . rifampin (RIFADIN) 300 MG capsule Take 2 capsules (600 mg total) by mouth daily. 60 capsule 3  . rifampin (RIFADIN) 300 MG capsule Take 2 capsules (600 mg total) by mouth daily. 60 capsule 0  . rifampin (RIFADIN) 300 MG capsule Take 2  capsules (600 mg total) by mouth daily. 60 capsule 0   No current facility-administered medications for this visit.     Musculoskeletal: Strength & Muscle Tone: UTA Gait & Station: normal Patient leans: N/A  Psychiatric Specialty Exam: Review of Systems  Psychiatric/Behavioral: The patient is nervous/anxious.   All other systems reviewed and are negative.   There were no vitals taken for this visit.There is no height or weight on file to calculate BMI.  General Appearance: Casual  Eye Contact:  Fair  Speech:  Clear and Coherent  Volume:  Normal  Mood:  Anxious, irritable on and off  Affect:  Congruent  Thought Process:  Goal Directed and Descriptions of Associations: Intact  Orientation:  Full (Time, Place, and Person)  Thought Content: Logical   Suicidal Thoughts:  No  Homicidal Thoughts:  No  Memory:  Immediate;   Fair Recent;   Fair Remote;   Fair  Judgement:  Fair  Insight:  Fair  Psychomotor Activity:  Normal  Concentration:  Concentration: Fair and Attention Span: Fair  Recall:  [employer] of Knowledge: Fair  Language: Fair  Akathisia:  No  Handed:  Right  AIMS (if indicated): UTA  Assets:  Communication Skills Desire for Improvement Housing Social Support Talents/Skills  ADL's:  Intact  Cognition: WNL  Sleep:  Fair   Screenings: AIMS     [facility]/15/2018 in [facility]  AIMS Total Score 0       Assessment and Plan: ALMER Stokes is a 29 year old Caucasian male, lives in [place], has a history of bipolar disorder type II was evaluated by telemedicine today.  Patient is biologically predisposed given his history of mental health problems in his family.  Patient with psychosocial stressors of the current pandemic, job related stressors.  Patient presented today reporting recent anxiety symptoms however declines medication changes when writer discussed adding an SSRI.  Patient also declined psychotherapy  referral.  Plan as noted below.  Plan Bipolar disorder type II in remission Wellbutrin XL 300 mg p.o. daily Abilify 5 mg p.o. daily  Anxiety disorder-unstable Patient reports recent anxiety attacks however declines medication changes when writer recommended adding an SSRI. Writer also discussed referral for psychotherapy sessions which he declines.  He reports he is trying to start treatment under a psychiatrist and a therapist who is more flexible with his schedule and does not want a follow-up appointment nor any medication changes today.  However he would like to get a letter stating that he can return to work. Start hydroxyzine 10 mg p.o. 3 times daily as needed for severe anxiety attacks.  Writer discussed with patient that the letter can be provided with the following recommendations that we made today-medication changes as well as follow-up appointment with our therapist.  A letter was printed out for patient today.  I have spent atleast 20 minutes non face to face with patient today. More than 50 % of the time was spent for preparing to see the patient ( e.g., review of test, records ), ordering medications and test ,psychoeducation and supportive psychotherapy and care coordination,as well as documenting clinical information in electronic health record. This note was generated in part or whole with voice recognition software. Voice recognition is usually quite accurate but there are transcription errors that can and very often do occur. I apologize for any typographical errors that were not detected and corrected.          Jomarie Longs, MD 10/09/2019, 9:00 PM

## 2019-10-28 ENCOUNTER — Telehealth: Payer: Self-pay

## 2019-10-30 NOTE — Telephone Encounter (Signed)
TC with patient.  TB RN checking in to see how Rifampin is going with new psych meds. Patient reports is doing well no new problems.  Offered to scheduled patient's next TBM appt; patient will call back to schedule. Richmond Campbell, RN

## 2019-10-31 NOTE — Telephone Encounter (Signed)
TC with patient.  Reports has 18 capsules. Scheduled #3 Rifampin appt 11/07/19 Richmond Campbell, RN

## 2019-11-07 ENCOUNTER — Other Ambulatory Visit: Payer: Self-pay

## 2019-11-10 ENCOUNTER — Ambulatory Visit (LOCAL_COMMUNITY_HEALTH_CENTER): Payer: Managed Care, Other (non HMO)

## 2019-11-10 VITALS — Wt 139.0 lb

## 2019-11-10 DIAGNOSIS — R7612 Nonspecific reaction to cell mediated immunity measurement of gamma interferon antigen response without active tuberculosis: Secondary | ICD-10-CM

## 2019-11-10 MED ORDER — RIFAMPIN 300 MG PO CAPS
600.0000 mg | ORAL_CAPSULE | Freq: Every day | ORAL | 0 refills | Status: AC
Start: 2019-11-10 — End: 2019-12-10

## 2019-11-10 NOTE — Progress Notes (Signed)
#  3 bottle of Rifampin 300mg  #60 dispensed today. , RN

## 2019-11-11 LAB — HEPATIC FUNCTION PANEL
ALT: 18 IU/L (ref 0–44)
AST: 21 IU/L (ref 0–40)
Albumin: 4.9 g/dL (ref 4.1–5.2)
Alkaline Phosphatase: 47 IU/L — ABNORMAL LOW (ref 48–121)
Bilirubin Total: 0.2 mg/dL (ref 0.0–1.2)
Bilirubin, Direct: 0.09 mg/dL (ref 0.00–0.40)
Total Protein: 6.5 g/dL (ref 6.0–8.5)

## 2019-12-08 ENCOUNTER — Ambulatory Visit (LOCAL_COMMUNITY_HEALTH_CENTER): Payer: Self-pay

## 2019-12-08 ENCOUNTER — Other Ambulatory Visit: Payer: Self-pay

## 2019-12-08 VITALS — Wt 140.0 lb

## 2019-12-08 DIAGNOSIS — R7612 Nonspecific reaction to cell mediated immunity measurement of gamma interferon antigen response without active tuberculosis: Secondary | ICD-10-CM

## 2019-12-08 MED ORDER — RIFAMPIN 300 MG PO CAPS
600.0000 mg | ORAL_CAPSULE | Freq: Every day | ORAL | 0 refills | Status: AC
Start: 2019-12-08 — End: 2020-01-07

## 2019-12-08 NOTE — Progress Notes (Signed)
Rifampin 300mg  #60 dispensed per [facility] order. End of treatment documents provided.  Instructed patient to f/u with medical provider once patient has finished Rifampin.  Psych meds may need to be adjusted once LTBI tx is complete; patient verbalized understanding.[facility], RN

## 2019-12-09 LAB — HEPATIC FUNCTION PANEL
ALT: 15 IU/L (ref 0–44)
AST: 21 IU/L (ref 0–40)
Albumin: 4.6 g/dL (ref 4.1–5.2)
Alkaline Phosphatase: 49 IU/L (ref 48–121)
Bilirubin Total: 0.5 mg/dL (ref 0.0–1.2)
Bilirubin, Direct: 0.21 mg/dL (ref 0.00–0.40)
Total Protein: 6.4 g/dL (ref 6.0–8.5)

## 2020-03-08 ENCOUNTER — Telehealth: Payer: 59 | Admitting: Psychiatry

## 2020-03-08 ENCOUNTER — Other Ambulatory Visit: Payer: Self-pay

## 2021-02-28 NOTE — Progress Notes (Signed)
03/01/2021 9:26 AM   [name] 1990-12-19 [id number]  Referring provider: Center, [facility] [address] [place],  [place] [address]  Chief Complaint  Patient presents with   VAS Consult    New Patient    HPI: John Stokes is a 30 y.o. male who presents today for further evaluation of possible vasectomy.  He denies a history of testicular trauma or pain.  No urinary issues.  No previous scrotal surgeries.  He has 2 children.   PMH: Past Medical History:  Diagnosis Date   ADHD (attention deficit hyperactivity disorder)    Bipolar disorder (HCC)     Surgical History: No past surgical history on file.  Home Medications:  Allergies as of 03/01/2021   No Known Allergies      Medication List        Accurate as of March 01, 2021  9:26 AM. If you have any questions, ask your nurse or doctor.          STOP taking these medications    hydrOXYzine 10 MG tablet Commonly known as: ATARAX/VISTARIL Stopped by: [name], MD       TAKE these medications    ARIPiprazole 10 MG tablet Commonly known as: ABILIFY Take 10 mg by mouth daily. What changed: Another medication with the same name was removed. Continue taking this medication, and follow the directions you see here. Changed by: [name], MD   lamoTRIgine 150 MG tablet Commonly known as: LAMICTAL Take 150 mg by mouth daily.   Wellbutrin XL 150 MG 24 hr tablet Generic drug: buPROPion Take 150 mg by mouth daily. What changed: Another medication with the same name was removed. Continue taking this medication, and follow the directions you see here. Changed by: [name], MD        Allergies: No Known Allergies  Family History: Family History  Problem Relation Age of Onset   Anxiety disorder Father    Alcohol abuse Father    Depression Mother    Anxiety disorder Sister     Social History:  reports that he quit smoking about 7 years ago. His smoking use  included cigarettes. He has never used smokeless tobacco. He reports current alcohol use. He reports that he does not currently use drugs.   Physical Exam: BP 134/82   Pulse 97   Ht 6' (1.829 m)   Wt 152 lb (68.9 kg)   BMI 20.61 kg/m   Constitutional:  Alert and oriented, No acute distress. HEENT:  AT, moist mucus membranes.  Trachea midline, no masses. Cardiovascular: No clubbing, cyanosis, or edema. Respiratory: Normal respiratory effort, no increased work of breathing. GI: Abdomen is soft, nontender, nondistended, no abdominal masses GU: Normal phallus.  Bilateral descended testicles without masses.  Vasa easily palpable bilaterally. Skin: No rashes, bruises or suspicious lesions. Neurologic: Grossly intact, no focal deficits, moving all 4 extremities. Psychiatric: Normal mood and affect.   Assessment & Plan:    1. Vasectomy evaluation Today, we discussed what the vas deferens is, where it is located, and its function. We reviewed the procedure for vasectomy, it's risks, benefits, alternatives, and likelihood of achieving his goals. We discussed in detail the procedure, complications, and recovery as well as the need for clearance prior to unprotected intercourse. We discussed that vasectomy does not protect against sexually transmitted diseases. We discussed that this procedure does not result in immediate sterility and that they would need to use other forms of birth control until he  has been cleared with negative postvasectomy semen analyses. I explained that the procedure is considered to be permanent and that attempts at reversal have varying degrees of success. These options include vasectomy reversal, sperm retrieval, and in vitro fertilization; these can be very expensive. We discussed the chance of postvasectomy pain syndrome which occurs in less than 5% of patients. I explained to the patient that there is no treatment to resolve this chronic pain, and that if it developed I  would not be able to help resolve the issue, but that surgery is generally not needed for correction. I explained there have even been reports of systemic like illness associated with this chronic pain, and that there was no good cure. I explained that vasectomy it is not a 100% reliable form of birth control, and the risk of pregnancy after vasectomy is approximately 1 in 2000 men who had a negative postvasectomy semen analysis or rare non-motile sperm. I explained that repeat vasectomy was necessary in less than 1% of vasectomy procedures when employing the type of technique that I use. I explained that he should refrain from ejaculation for approximately one week following vasectomy. I explained that there are other options for birth control which are permanent and non-permanent; we discussed these. I explained the rates of surgical complications, such as symptomatic hematoma or infection, are low (1-2%) and vary with the surgeon's experience and criteria used to diagnose the complication.   The patient had the opportunity to ask questions to his stated satisfaction. He voiced understanding of the above factors and stated that he has read all the information provided to him and the packets and informed consent.  He is interested in receiving of Valium 10 mg prior to the procedure for the purpose of anxiolysis.  A prescription was given today.  He will have a driver on the day of the procedure.   Follow-up for vasectomy   I,Kailey Littlejohn,acting as a scribe for [name], MD.,have documented all relevant documentation on the behalf of [name], MD,as directed by  [name], MD while in the presence of [name], MD.   [facility] [address], [address] [place], [place] [address] [phone]

## 2021-03-01 ENCOUNTER — Other Ambulatory Visit: Payer: Self-pay

## 2021-03-01 ENCOUNTER — Ambulatory Visit (INDEPENDENT_AMBULATORY_CARE_PROVIDER_SITE_OTHER): Payer: Managed Care, Other (non HMO) | Admitting: Urology

## 2021-03-01 VITALS — BP 134/82 | HR 97 | Ht 72.0 in | Wt 152.0 lb

## 2021-03-01 DIAGNOSIS — Z3009 Encounter for other general counseling and advice on contraception: Secondary | ICD-10-CM

## 2021-03-01 MED ORDER — DIAZEPAM 10 MG PO TABS
10.0000 mg | ORAL_TABLET | Freq: Once | ORAL | 0 refills | Status: AC
Start: 2021-03-01 — End: 2021-03-01

## 2021-03-01 NOTE — Patient Instructions (Signed)

## 2021-05-03 NOTE — Progress Notes (Signed)
05/06/21  CC:  Chief Complaint  Patient presents with   VAS    HPI: John Stokes is a 31 y.o. male who presents today for a vasectomy.   He denies a history of testicular trauma or pain. No urinary issues.  No previous scrotal surgeries.   He has 2 children.    NED. A&Ox3.   No respiratory distress   Abd soft, NT, ND Normal external genitalia with patent urethral meatus   Timeout was performed and consent was confirmed.  Bilateral Vasectomy Procedure  Pre-Procedure: - Patient's scrotum was prepped and draped for vasectomy. - The vas was palpated through the scrotal skin on the left. - 1% Xylocaine was injected into the skin and surrounding tissue for placement  - In a similar manner, the vas on the right was identified, anesthetized, and stabilized.  Procedure: - A #11 blade was used to make a small stab incision in the skin overlying the vas - The left vas was isolated and brought up through the incision exposing that structure. - Bleeding points were cauterized as they occurred. - The vas was free from the surrounding structures and brought to the view. - A segment was positioned for placement with a hemostat. - A second hemostat was placed and a small segment between the two hemostats and was removed for inspection. - Each end of the transected vas lumen was fulgurated/ obliterated using needlepoint electrocautery -A fascial interposition was performed on testicular end of the vas using #3-0 chromic suture -The same procedure was performed on the right. -On the right side, there was some bleeding from perivasal artery just adjacent to the vas.  Try to cauterize this unsuccessfully and oversewing it with a catgut for excellent hemostasis. - A single suture of #3-0 chromic catgut was used to close each lateral scrotal skin incision - A dressing was applied.  Post-Procedure: - Patient was instructed in care of the operative area - A specimen is to be delivered in 12  weeks   -Another form of contraception is to be used until post vasectomy semen analysis  [name], MD

## 2021-05-06 ENCOUNTER — Ambulatory Visit (INDEPENDENT_AMBULATORY_CARE_PROVIDER_SITE_OTHER): Payer: Managed Care, Other (non HMO) | Admitting: Urology

## 2021-05-06 ENCOUNTER — Other Ambulatory Visit: Payer: Self-pay

## 2021-05-06 VITALS — BP 134/83 | HR 76 | Ht 72.0 in | Wt 155.0 lb

## 2021-05-06 DIAGNOSIS — Z3009 Encounter for other general counseling and advice on contraception: Secondary | ICD-10-CM | POA: Diagnosis not present

## 2021-05-06 NOTE — Patient Instructions (Signed)

## 2021-05-18 ENCOUNTER — Telehealth: Payer: Self-pay | Admitting: *Deleted

## 2021-05-18 NOTE — Telephone Encounter (Signed)
Patient called Triage line to state he has had some mild discomfort post vasectomy done on 05/06/21. Still has mild bruising with a lump.  He has been taking Tylenol for pain. Offered an appointment for an evaluation. Patient declined states he will try to apply a heat pad to see if the area improves. Aware to call back if no improvement.

## 2021-05-24 ENCOUNTER — Ambulatory Visit: Payer: Managed Care, Other (non HMO) | Admitting: Urology

## 2021-08-04 ENCOUNTER — Other Ambulatory Visit: Payer: Managed Care, Other (non HMO)

## 2021-08-04 DIAGNOSIS — Z3009 Encounter for other general counseling and advice on contraception: Secondary | ICD-10-CM

## 2021-08-06 LAB — POST-VAS SPERM EVALUATION,QUAL: Volume: 2.3 mL
